# Patient Record
Sex: Female | Born: 1985 | Hispanic: No | Marital: Married | Smoking: Never smoker
Health system: Southern US, Community
[De-identification: ages and names within clinical notes are randomized; demographics above are authoritative.]

## PROBLEM LIST (undated history)

## (undated) DIAGNOSIS — Z789 Other specified health status: Secondary | ICD-10-CM

## (undated) HISTORY — PX: NO PAST SURGERIES: SHX2092

## (undated) HISTORY — DX: Other specified health status: Z78.9

---

## 2010-06-05 NOTE — L&D Delivery Note (Addendum)
Delivery Note At 11:39 PM a viable female was delivered via Vaginal, Spontaneous Delivery (Presentation: Right Occiput Anterior). The head was very slow coming, and a turtle sign was noted.  Dr. Despina Hidden called to come.  The right (posterior) axilla was grasped with my index finger, rotated clockwise into an oblique position, and the right shoulder delivered anteriorly.  THe body delivered without difficulty after that. APGAR: 4, 8; weight 7 lb 12.3 oz (3525 g).   Placenta status: Intact, Spontaneous.  Cord: 3 vessels with the following complications: None. Dr. Armen Pickup performed delivery of fetal head, I took over for the shoulders, and she completed 3rd stage  Anesthesia: Epidural  Episiotomy: None Lacerations: 1st degree;Periurethral Suture Repair: 3-0 Est. Blood Loss (mL): 200  Mom to postpartum.  Baby to nursery-stable.  CRESENZO-DISHMAN,Kanye Depree 04/16/2011, 11:57 PM

## 2010-10-18 ENCOUNTER — Other Ambulatory Visit: Payer: Self-pay

## 2010-10-18 ENCOUNTER — Ambulatory Visit (INDEPENDENT_AMBULATORY_CARE_PROVIDER_SITE_OTHER): Payer: Self-pay | Admitting: Family Medicine

## 2010-10-18 ENCOUNTER — Encounter: Payer: Self-pay | Admitting: Family Medicine

## 2010-10-18 VITALS — BP 117/76 | Temp 97.9°F | Wt 151.8 lb

## 2010-10-18 DIAGNOSIS — Z3400 Encounter for supervision of normal first pregnancy, unspecified trimester: Secondary | ICD-10-CM | POA: Insufficient documentation

## 2010-10-18 DIAGNOSIS — Z331 Pregnant state, incidental: Secondary | ICD-10-CM

## 2010-10-18 NOTE — Progress Notes (Signed)
Prenatal labs done today Krystal Marshall 

## 2010-10-18 NOTE — Patient Instructions (Signed)
Nice to meet you. Come to pregnancy group on Thursday at 2:30 at [facility]. If you are unable to get off work, call for another appointment.  Your baby sounds healthy.  Pregnancy - First Trimester During sexual intercourse, millions of sperm go into the vagina. Only 1 sperm will penetrate and fertilize the female egg while it is in the Fallopian tube. One week later, the fertilized egg implants into the wall of the uterus. An embryo begins to develop into a baby. At 6 to 8 weeks, the eyes and face are formed and the heartbeat can be seen on ultrasound. At the end of 12 weeks (first trimester), all the baby's organs are formed. Now that you are pregnant, you will want to do everything you can to have a healthy baby. Two of the most important things are to get good prenatal care and follow your caregiver's instructions. Prenatal care is all the medical care you receive before the baby's birth. It is given to prevent, find and treat problems during the pregnancy and childbirth. PRENATAL EXAMS:  During prenatal visits, your weight, blood pressure and urine are checked. This is done to make sure you are healthy and progressing normally during the pregnancy.   A pregnant woman should gain 25 to 35 pounds during the pregnancy. However, if you are over weight or underweight, your caregiver will advise you regarding your weight.   Your caregiver will ask and answer questions for you.   Blood work, cervical cultures, other necessary tests and a Pap test are done during your prenatal exams. These tests are done to check on your health and the probable health of your baby. Tests are strongly recommended and done for HIV with your permission. This is the virus that causes AIDS. These tests are done because medications can be given to help prevent your baby from being born with this infection should you have been infected without knowing it. Blood work is also used to find out your blood type, previous  infections and follow your blood levels (hemoglobin).   Low hemoglobin (anemia) is common during pregnancy. Iron and vitamins are given to help prevent this. Later in the pregnancy, blood tests for diabetes will be done along with any other tests if any problems develop. You may need tests to make sure you and the baby are doing well.   You may need other tests to make sure you and the baby are doing well.  CHANGES DURING THE FIRST TRIMESTER (THE FIRST 3 MONTHS OF PREGNANCY) Your body goes through many changes during pregnancy. They vary from person to person. Talk to your caregiver about changes you notice and are concerned about. Changes can include:  Your menstrual period stops.   The egg and sperm carry the genes that determine what you look like. Genes from you and your partner are forming a baby. The female genes determine whether the baby is a boy or a girl.   Your body increases in girth and you may feel bloated.   Feeling sick to your stomach (nauseous) and throwing up (vomiting). If the vomiting is uncontrollable, call your caregiver.   Your breasts will begin to enlarge and become tender.   Your nipples may stick out more and become darker.   The need to urinate more. Painful urination may mean you have a bladder infection.   Tiring easily.   Loss of appetite.   Cravings for certain kinds of food.   At first, you may gain or  lose a couple of pounds.   You may have changes in your emotions from day to day (excited to be pregnant or concerned something may go wrong with the pregnancy and baby).   You may have more vivid and strange dreams.  HOME CARE INSTRUCTIONS  It is very important to avoid all smoking, alcohol and un-prescribed drugs during your pregnancy. These affect the formation and growth of the baby. Avoid chemicals while pregnant to ensure the delivery of a healthy infant.   Start your prenatal visits by the 12th week of pregnancy. They are usually scheduled  monthly at first, then more often in the last 2 months before delivery. Keep your caregiver's appointments. Follow your caregiver's instructions regarding medication use, blood and lab tests, exercise, and diet.   During pregnancy, you are providing food for you and your baby. Eat regular, well-balanced meals. Choose foods such as meat, fish, milk and other low fat dairy products, vegetables, fruits, and whole-grain breads and cereals. Your caregiver will tell you of the ideal weight gain.   You can help morning sickness by keeping soda crackers (saltines) at the bedside. Eat a couple before arising in the morning. You may want to use the crackers without salt on them.   Eating 4 to 5 small meals rather than 3 large meals a day also may help the nausea and vomiting.   Drinking liquids between meals instead of during meals also seems to help nausea and vomiting.   A physical sexual relationship may be continued throughout pregnancy if there are no other problems. Problems may be early (premature) leaking of amniotic fluid from the membranes, vaginal bleeding, or belly (abdominal) pain.   Exercise regularly if there are no restrictions. Check with your caregiver or physical therapist if you are unsure of the safety of some of your exercises. Greater weight gain will occur in the last 2 trimesters of pregnancy. Exercising will help:   Control your weight.   Keep you in shape.   Prepare you for labor and delivery.   Help you lose your pregnancy weight after you deliver your baby.   Wear a good support or jogging bra for breast tenderness during pregnancy. This may help if worn during sleep too.   Ask when prenatal classes are available. Begin classes when they are offered.   Do not use hot tubs, steam rooms or saunas.   Wear your seat belt when driving. This protects you and your baby if you are in an accident.   Avoid raw meat, uncooked cheese, cat litter boxes and soil used by cats  throughout the pregnancy. These carry germs that can cause birth defects in the baby.   The first trimester is a good time to visit your dentist for your dental health. Getting your teeth cleaned is OK. Use a softer toothbrush and brush gently during pregnancy.   Ask for help if you have financial, counseling or nutritional needs during pregnancy. Your caregiver will be able to offer counseling for these needs as well as refer you for other special needs.   Do not take any medications or herbs unless told by your caregiver.   Inform your caregiver if there is any mental or physical domestic violence.   Make a list of emergency phone numbers of family, friends, hospital, police and fire department.   Write down your questions. Take them to your prenatal visit.   Do not douche.   Do not cross your legs.   If you have  to stand for long periods of time, rotate you feet or take small steps in a circle.   You may have more vaginal secretions that may require a sanitary pad. Do not use tampons or scented sanitary pads.  MEDICATIONS AND DRUG USE IN PREGNANCY  Take prenatal vitamins as directed. The vitamin should contain 1 milligram of folic acid. Keep all vitamins out of reach of children. Only a couple vitamins or tablets containing iron may be fatal to a baby or young child when ingested.   Avoid use of all medications, including herbs, over-the-counter medications, not prescribed or suggested by your caregiver. Only take over-the-counter or prescription medicines for pain, discomfort, or fever as directed by your caregiver. Do not use aspirin, ibuprofen (Motrin, Advil, Nuprin) or naproxen (Aleve) unless OK'd by your caregiver.   Let your caregiver also know about herbs you may be using.   Alcohol is related to a number of birth defects. This includes fetal alcohol syndrome. All alcohol, in any form, should be avoided completely. Smoking will cause low birth rate and premature babies.    Street/illegal drugs are very harmful to the baby. They are absolutely forbidden. A baby born to an addicted mother will be addicted at birth. The baby will go through the same withdrawal an adult does.   Let your caregiver know about any medications that you have to take and for what reason you take them.  MISCARRIAGE IS COMMON DURING PREGNANCY A miscarriage does not mean you did something wrong. It is not a reason to worry about getting pregnant again. Your caregiver will help you with questions you may have. If you have a miscarriage, you may need minor surgery (a D & C). SEEK MEDICAL CARE IF:  You have any concerns or worries during your pregnancy. It is better to call with your questions if you feel they cannot wait, rather than worry about them.  SEEK IMMEDIATE MEDICAL CARE IF:  An unexplained oral temperature above 101 develops, or as your caregiver suggests.   You have leaking of fluid from the vagina (birth canal). If leaking membranes are suspected, take your temperature and inform your caregiver of this when you call.   There is vaginal spotting or bleeding. Notify your caregiver of the amount and how many pads are used.   You develop a bad smelling vaginal discharge with a change in the color.   You continue to feel sick to your stomach (nauseated) and have no relief from remedies suggested. You vomit blood or coffee ground like materials.   You lose more than 2 pounds of weight in one week.   You gain more than 2 pounds of weight in a week and you notice swelling of your face, hands, feet or legs.   You gain 5 pounds or more in 1 week (even if you do not have swelling of your hands, face, legs or feet).   You get exposed to [place] measles and have never had them.   You are exposed to fifth disease or chicken pox.   You develop belly (abdominal) pain. Round ligament discomfort is a common non-cancerous (benign) cause of abdominal pain in pregnancy. Your caregiver still  must evaluate this.   You develop headache, fever, diarrhea, pain with urination, or shortness of breath.   You fall, are in a car accident or have any kind of trauma.   There is mental or physical violence in your home.  Document Released: 05/16/2001 Document Re-Released: 11/09/2009 [employer] Patient Information  2011 [place], [place].

## 2010-10-18 NOTE — Progress Notes (Signed)
G1 presents with uncertain LMP. Cultural/language barrier, recently immigrated from [place], Arabic is primary language. Husband interprets. No significant PMH, FH. Plans to join [facility] program for group prenatal care. Only problems are finding stable job and financial hardship. Medicaid is pending currently. No concerns with health and weight is stable.  Physical exam unremarkable, fundus located approximately at umbilicus, nontender.  F/u at group prenatal care then in 4 weeks. Will continue routine care and plan for dating ultrasound/anatomy scan as soon as medicaid allows. Prenatal labs today. Will need pap smear and GC/Chl screen at next visit.

## 2010-10-19 LAB — HIV ANTIBODY (ROUTINE TESTING W REFLEX): HIV: NONREACTIVE

## 2010-10-19 LAB — OBSTETRIC PANEL
Antibody Screen: NEGATIVE
Basophils Absolute: 0 10*3/uL (ref 0.0–0.1)
Basophils Relative: 0 % (ref 0–1)
Eosinophils Absolute: 0.1 10*3/uL (ref 0.0–0.7)
Eosinophils Relative: 1 % (ref 0–5)
HCT: 35 % — ABNORMAL LOW (ref 36.0–46.0)
Hemoglobin: 11.8 g/dL — ABNORMAL LOW (ref 12.0–15.0)
Hepatitis B Surface Ag: NEGATIVE
Lymphocytes Relative: 30 % (ref 12–46)
Lymphs Abs: 3.6 10*3/uL (ref 0.7–4.0)
MCH: 26.9 pg (ref 26.0–34.0)
MCHC: 33.7 g/dL (ref 30.0–36.0)
MCV: 79.7 fL (ref 78.0–100.0)
Monocytes Absolute: 0.6 10*3/uL (ref 0.1–1.0)
Monocytes Relative: 5 % (ref 3–12)
Neutro Abs: 7.6 10*3/uL (ref 1.7–7.7)
Neutrophils Relative %: 64 % (ref 43–77)
Platelets: 326 10*3/uL (ref 150–400)
RBC: 4.39 MIL/uL (ref 3.87–5.11)
RDW: 14.4 % (ref 11.5–15.5)
Rh Type: POSITIVE
Rubella: >500 IU/mL — ABNORMAL HIGH
WBC: 11.9 10*3/uL — ABNORMAL HIGH (ref 4.0–10.5)

## 2010-10-19 LAB — SICKLE CELL SCREEN: Sickle Cell Screen: NEGATIVE

## 2010-10-19 NOTE — Progress Notes (Signed)
Note reviewed. Labs still pending. Given her unsure LMP, will need dating sono asap.  Will discuss at group visit tomorrow.  Will also need to confirm varicella status and do pap/gc/cz. Since pt was referred through [facility], it is unlikely that she will qualify for Medicaid.  Will suggest that she apply for orange card.

## 2010-10-20 ENCOUNTER — Ambulatory Visit: Payer: Self-pay | Admitting: Family Medicine

## 2010-10-20 VITALS — BP 126/78 | Wt 155.0 lb

## 2010-10-20 DIAGNOSIS — Z3400 Encounter for supervision of normal first pregnancy, unspecified trimester: Secondary | ICD-10-CM

## 2010-10-20 LAB — CULTURE, OB URINE
Colony Count: NO GROWTH
Organism ID, Bacteria: NO GROWTH

## 2010-10-20 NOTE — Progress Notes (Signed)
Here for group visit.  S: Feels well today. No symptoms. Is happy and healthy. Very excited about her baby! O: Vs noted. See flowsheet.  A:/P 25 yo G1P0 at 17 weeks and 4 days. Feels well.  1) Pregnancy: Needs [place] scheduled. Also needs internal exam and labs performed today.   Addendum: Pelvic exam:Pt's first pelvic exam  And she is very apprehensive.  Normal external female genitalia.  No external lesions.  Nl. Vaginal mucosa.  No discharge.  Nl cervix without lesions.  Scant cervical discharge.  Gc/cz cx done today.  Pap done.  Bimanual: no adnexal masses or TTP.  Gravid uterus.  Consistent with dates.  I have reviewed Dr. Zollie Pee note and agree with his plan.  Ms. Riches has recently (about 20 days ago) arrived in [place] and feels somewhat isolated.  Her husband works a few shifts at [employer], but finances are difficult.  We discussed the importance of an early ultrasound for dating, but she really cannot afford it right now.  She reports she has applied for Medicaid and hopes it will come through soon.  Information about the orange card given and pt encouraged to make appt to apply.

## 2010-10-21 ENCOUNTER — Telehealth: Payer: Self-pay | Admitting: *Deleted

## 2010-10-21 LAB — GC/CHLAMYDIA PROBE AMP, GENITAL
Chlamydia, DNA Probe: NEGATIVE
GC Probe Amp, Genital: NEGATIVE

## 2010-10-21 LAB — VARICELLA ZOSTER ANTIBODY, IGG: Varicella IgG: 4.34 {ISR} — ABNORMAL HIGH

## 2010-10-21 NOTE — Telephone Encounter (Signed)
Called and left message for patient to return call. She has [place] scheduled with dr Gaynell Face on 10/28/10 at 11:30. Office is located on [address]. If patient cannot keep appointment she needs to call and reschedule at [phone].Krystal Marshall, [name]

## 2010-10-21 NOTE — Progress Notes (Signed)
[place] scheduled with Dr Gaynell Face on 10/28/10 at 11:30.

## 2010-10-28 ENCOUNTER — Telehealth: Payer: Self-pay | Admitting: Family Medicine

## 2010-10-28 NOTE — Telephone Encounter (Signed)
Patient never returned call.Busick, Robert Lee  

## 2010-10-28 NOTE — Telephone Encounter (Signed)
Calling to let us know pt showed up for u/s appt but says no one told her she had to pay $200.

## 2010-11-16 NOTE — Progress Notes (Signed)
Here for group visit. Is accompanied by her husband Timothy Lasso) S: Pt is doing well. She complains of feet and hand falling asleep and pain. Swelling in legs. She is not feeling the baby move yet. She and her husband are working on obtaining the orange card for health insurance and request referral/list of social services to assist with finding employment. Sheee denies vaginal bleeding and discharge. She takes her prenatal vitamin sometimes.  O: VS reviewed. BP normal.  GEN: NAD. Well appearing.  ABD: Gravid, above umbilicus EXT: no edema A/P:  25 yo primigravida at [redacted]w[redacted]d by uncertain LMP.  1. Pregnancy-progressing well. Good interval wt gain. Prenatal labs reviewed. Plan:  Pt will need an individual visit for pelvic exam, GC/Chlam and pap smear. Will call pt and flag administration.  Ask about varicella status. Plan to get pt in with myself or Dr. Fara Boros at earliest available appointment. We discussed physical activity in pregnancy and had an introduction into preterm labor.  Yoga instructor demonstrated some exercises that may improve circulation and soothe muscle so extremity pain is hopefully not as bad. The container focused on carpal tunnel, round ligament pain, headache and breast tenderness. Plan to have pt f/u and next GIFT visit.  2. Swelling- no LE edema today. BP normal. Advised trial of decreased salt intake to determine is too much salt is causing fluid retention. 3. No sensation of fetal movement-Per pt she has never felt the baby move well. Interval wt gain and strong FHT are reassuring. I anticipate Fetal movement to be appreciated within the next few weeks.  4. F/u- at next available appointment (myself or [facility]) for pelvic exam. In 4 weeks for next GIFT.

## 2010-11-17 ENCOUNTER — Encounter: Payer: Self-pay | Admitting: Family Medicine

## 2010-11-17 ENCOUNTER — Ambulatory Visit (INDEPENDENT_AMBULATORY_CARE_PROVIDER_SITE_OTHER): Payer: Medicaid Other | Admitting: Family Medicine

## 2010-11-17 VITALS — BP 115/70 | Wt 158.0 lb

## 2010-11-17 DIAGNOSIS — Z3400 Encounter for supervision of normal first pregnancy, unspecified trimester: Secondary | ICD-10-CM

## 2010-11-17 NOTE — Assessment & Plan Note (Signed)
See progress note.

## 2010-11-21 ENCOUNTER — Telehealth: Payer: Self-pay | Admitting: Family Medicine

## 2010-11-21 DIAGNOSIS — Z3400 Encounter for supervision of normal first pregnancy, unspecified trimester: Secondary | ICD-10-CM

## 2010-11-21 NOTE — Telephone Encounter (Signed)
Made appt for [place] June 22,2012 @ 845 am at [facility] radiology. Pt notified and agreed.Laureen Ochs, Viann Shove

## 2010-11-21 NOTE — Telephone Encounter (Signed)
Patient stopped by and now has pregnancy medicaid.  She would like to be set up with an ultrasound now.  Please call her concerning this.  Thanks!

## 2010-11-21 NOTE — Progress Notes (Signed)
Reviewed patients chart and discussed with Dr. [place]. The pt did have a pelvic exam with CG/Chlam and Pap done. All normal.

## 2010-11-25 ENCOUNTER — Ambulatory Visit (HOSPITAL_COMMUNITY)
Admission: RE | Admit: 2010-11-25 | Discharge: 2010-11-25 | Disposition: A | Discharge status: Home / Self Care | Payer: Medicaid Other | Source: Ambulatory Visit | Attending: Family Medicine | Admitting: Family Medicine

## 2010-11-25 DIAGNOSIS — O358XX0 Maternal care for other (suspected) fetal abnormality and damage, not applicable or unspecified: Secondary | ICD-10-CM | POA: Insufficient documentation

## 2010-11-25 DIAGNOSIS — Z3400 Encounter for supervision of normal first pregnancy, unspecified trimester: Secondary | ICD-10-CM

## 2010-11-25 DIAGNOSIS — Z1389 Encounter for screening for other disorder: Secondary | ICD-10-CM | POA: Insufficient documentation

## 2010-11-25 DIAGNOSIS — Z363 Encounter for antenatal screening for malformations: Secondary | ICD-10-CM | POA: Insufficient documentation

## 2010-12-05 ENCOUNTER — Encounter: Payer: Self-pay | Admitting: Family Medicine

## 2010-12-05 NOTE — Progress Notes (Signed)
Updated EDC based on recent [redacted]w[redacted]d U/S. Will use U/S for due date since pt was unsure of LMP and the dates are 4+ weeks off.

## 2010-12-15 ENCOUNTER — Ambulatory Visit (INDEPENDENT_AMBULATORY_CARE_PROVIDER_SITE_OTHER): Payer: Medicaid Other | Admitting: Family Medicine

## 2010-12-15 DIAGNOSIS — Z3480 Encounter for supervision of other normal pregnancy, unspecified trimester: Secondary | ICD-10-CM

## 2010-12-15 NOTE — Patient Instructions (Signed)
I am glad to see you today - congratulations on the job! We will plan to see you at [facility] for out next appointment - July 26 at 2:30. If you have bleeding, frequent contractions, or leaking of liquid from your vagina, please go to [facility] hospital.

## 2010-12-15 NOTE — Progress Notes (Signed)
25 yo here presents for group after group ended - husband now working third shift, so hard to get to appts.  Does have some pain in legs at the end of the day.  No vaginal discharge

## 2010-12-27 ENCOUNTER — Encounter: Payer: Self-pay | Admitting: Family Medicine

## 2010-12-29 ENCOUNTER — Ambulatory Visit (INDEPENDENT_AMBULATORY_CARE_PROVIDER_SITE_OTHER): Payer: Medicaid Other | Admitting: Family Medicine

## 2010-12-29 VITALS — BP 119/60 | Wt 164.0 lb

## 2010-12-29 DIAGNOSIS — Z3400 Encounter for supervision of normal first pregnancy, unspecified trimester: Secondary | ICD-10-CM

## 2010-12-29 NOTE — Patient Instructions (Signed)
Our next group will meet as scheduled on Thursday, August 9th at 2:30. We will meet in classroom 4 at [facility].  This is by the clinics and the education entrance.    If you have bleeding, if your water breaks, if you have regular contractions that last for 2 hours, or if the baby is not moving well, please go to [facility] to be evaluated.

## 2010-12-29 NOTE — Progress Notes (Signed)
S: Here today for group prenatal visit.  No specific concerns.  Is having some swelling in feet at night time, but otherwise doing well.  O: See flow sheet  A/P: G1P0 @ 23.2 by 18 wk [place] 1) Pregnancy: Progressing well; will need 1hr GTT and 28 week labs in 4 weeks at next appt at [facility] 2) Social: Required [place] to find transportation for her to get home today; will need to d/w pt that she should be making these arrangements  3) F/u in 2 weeks at [facility] for next group visit.

## 2010-12-30 ENCOUNTER — Ambulatory Visit: Payer: Medicaid Other

## 2011-01-02 NOTE — Progress Notes (Signed)
Pt seen in GIFT group prenatal visit with Dr. Fara Boros Reviewed baby care, preterm labor and kick counts.

## 2011-01-11 ENCOUNTER — Telehealth: Payer: Self-pay | Admitting: Family Medicine

## 2011-01-11 NOTE — Telephone Encounter (Signed)
Left VM for pt and husband reminding them that we meet for GIFT tomorrow at  [facility] in Classroom 4 at 2:30.

## 2011-01-13 ENCOUNTER — Ambulatory Visit (INDEPENDENT_AMBULATORY_CARE_PROVIDER_SITE_OTHER): Payer: Medicaid Other | Admitting: Family Medicine

## 2011-01-13 ENCOUNTER — Telehealth: Payer: Self-pay | Admitting: *Deleted

## 2011-01-13 ENCOUNTER — Ambulatory Visit: Payer: Medicaid Other | Admitting: Family Medicine

## 2011-01-13 VITALS — BP 120/76 | HR 80 | Temp 98.1°F | Ht 65.0 in | Wt 164.0 lb

## 2011-01-13 DIAGNOSIS — J029 Acute pharyngitis, unspecified: Secondary | ICD-10-CM | POA: Insufficient documentation

## 2011-01-13 DIAGNOSIS — Z3400 Encounter for supervision of normal first pregnancy, unspecified trimester: Secondary | ICD-10-CM

## 2011-01-13 LAB — POCT RAPID STREP A (OFFICE): Rapid Strep A Screen: NEGATIVE

## 2011-01-13 NOTE — Telephone Encounter (Signed)
Patient reports fever started yesterday. She has not actually checked temp but feels feverish. Has cold symptoms, congestion in nose and cough, appointment scheduled today at 1:30 PM

## 2011-01-13 NOTE — Patient Instructions (Addendum)
Viral Pharyngitis Viral pharyngitis is a sore throat caused by a cold virus. This is not a strep throat. This does not require treatment by an antibiotic (medications that kill germs). It will get better on its own. Antibiotics generally will not help unless this condition is followed by a bacterial (germ) infection. HOME CARE INSTRUCTIONS  Drink or give your child plenty of fluids. Soft, cold foods such as ice cream, popsicles, or jello may be used.   Gargle with warm salt water (one teaspoon salt per quart of water).   If over age 86, throat lozenges may be used safely.   Only take over-the-counter or prescription medicines for pain, discomfort, or fever as directed by your caregiver. DO NOT USE ASPIRIN.  SEEK MEDICAL CARE IF:  You are better in a few days, then become worse.   You have a fever or pain unresponsive to pain medicines.   There are any other changes that concern you.  Diagnostic tests may have been performed. If you have not received results at time of discharge, receive instructions as how to obtain these results.  Document Released: 03/01/2005 Document Re-Released: 11/08/2007 [facility] Patient Information 2011 [place], [place].   Thank you all for coming in today. You appear to have a viral infection. Please see the following care instructions. Please take Tylenol for pain/aches.   Since you are measuring greater than your dates. I have set up an U/S. Also please come back to the clinic next week Friday for a 1 hr glucola.   -Dr. Armen Pickup

## 2011-01-13 NOTE — Progress Notes (Signed)
  Subjective:    Patient ID: Krystal Marshall, female    DOB: November 26, 1985, 25 y.o.   MRN: [id number]  HPI Pt here accompanied by husband. She missed the GIFT group yesterday because she was feeling ill. She reports one day history of sore throat, hot and cold flashes, body aches, nausea and 2 episodes of vomiting. She describes NB/NB emesis yesterday AM and this AM. She denies sick contacts. 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 She has not taking anything for her symptoms.   She reports fetal movement. She denies vaginal bleeding, LOF, contractions.   Review of Systems As per HPI     Objective:   Physical Exam  Constitutional: She appears well-developed and well-nourished. No distress.  HENT:  Head: Normocephalic and atraumatic.  Right Ear: Tympanic membrane, external ear and ear canal normal.  Left Ear: Tympanic membrane, external ear and ear canal normal.  Nose: Nose normal.  Mouth/Throat: Uvula is midline and mucous membranes are normal. Posterior oropharyngeal erythema present. No oropharyngeal exudate or tonsillar abscesses.       Shotty anterior cervical lymphadenopathy.   Abdominal:       Gravid, FH 28 cm, FHT 164, FM appreciated.           Assessment & Plan:

## 2011-01-16 NOTE — Assessment & Plan Note (Addendum)
Reassuring maternal fetal status.   Reviewed prenatal labs. Size > dates: f/u ultrasound and 1 hr glucola (1 week).  F/u in 2 weeks for next GIFT.

## 2011-01-16 NOTE — Assessment & Plan Note (Signed)
History and physical exam findings consistent with viral pharyngitis. Pt does not meet criteria for strep. No strep on rapid swab. Plan: conservative treatment with salt water gargles, popsicles, tylenol, rest.  Red flags to prompt return to care.

## 2011-01-20 ENCOUNTER — Ambulatory Visit (HOSPITAL_COMMUNITY): Admission: RE | Admit: 2011-01-20 | Payer: Medicaid Other | Source: Ambulatory Visit

## 2011-01-25 ENCOUNTER — Ambulatory Visit (HOSPITAL_COMMUNITY)
Admission: RE | Admit: 2011-01-25 | Discharge: 2011-01-25 | Disposition: A | Discharge status: Home / Self Care | Payer: Medicaid Other | Source: Ambulatory Visit | Attending: Family Medicine | Admitting: Family Medicine

## 2011-01-25 DIAGNOSIS — Z3400 Encounter for supervision of normal first pregnancy, unspecified trimester: Secondary | ICD-10-CM

## 2011-01-25 DIAGNOSIS — Z3689 Encounter for other specified antenatal screening: Secondary | ICD-10-CM | POA: Insufficient documentation

## 2011-01-25 DIAGNOSIS — O3660X0 Maternal care for excessive fetal growth, unspecified trimester, not applicable or unspecified: Secondary | ICD-10-CM | POA: Insufficient documentation

## 2011-01-26 ENCOUNTER — Ambulatory Visit: Payer: Medicaid Other | Admitting: Family Medicine

## 2011-02-08 ENCOUNTER — Telehealth: Payer: Self-pay | Admitting: Family Medicine

## 2011-02-08 NOTE — Telephone Encounter (Signed)
Lft message for pt on husband's cell reminding her of appt tomorrow.  Also emphasized importance of keeping appt and need for lab tests.

## 2011-02-09 ENCOUNTER — Encounter: Payer: Self-pay | Admitting: Family Medicine

## 2011-02-22 ENCOUNTER — Encounter (HOSPITAL_COMMUNITY): Payer: Self-pay | Admitting: *Deleted

## 2011-02-22 ENCOUNTER — Inpatient Hospital Stay (HOSPITAL_COMMUNITY)
Admission: AD | Admit: 2011-02-22 | Discharge: 2011-02-22 | Disposition: A | Discharge status: Home / Self Care | Payer: Medicaid Other | Source: Ambulatory Visit | Attending: Obstetrics & Gynecology | Admitting: Obstetrics & Gynecology

## 2011-02-22 ENCOUNTER — Telehealth: Payer: Self-pay | Admitting: Family Medicine

## 2011-02-22 DIAGNOSIS — O99891 Other specified diseases and conditions complicating pregnancy: Secondary | ICD-10-CM | POA: Insufficient documentation

## 2011-02-22 DIAGNOSIS — Z711 Person with feared health complaint in whom no diagnosis is made: Secondary | ICD-10-CM

## 2011-02-22 LAB — URINE MICROSCOPIC-ADD ON

## 2011-02-22 LAB — URINALYSIS, ROUTINE W REFLEX MICROSCOPIC
Bilirubin Urine: NEGATIVE
Glucose, UA: NEGATIVE mg/dL
Ketones, ur: NEGATIVE mg/dL
Leukocytes, UA: NEGATIVE
Nitrite: NEGATIVE
Protein, ur: NEGATIVE mg/dL
Specific Gravity, Urine: <1.005 — ABNORMAL LOW (ref 1.005–1.030)
Urobilinogen, UA: 0.2 mg/dL (ref 0.0–1.0)
pH: 6 (ref 5.0–8.0)

## 2011-02-22 LAB — FETAL FIBRONECTIN: Fetal Fibronectin: NEGATIVE

## 2011-02-22 MED ORDER — HYDROXYZINE HCL 50 MG/ML IM SOLN
50.0000 mg | Freq: Once | INTRAMUSCULAR | Status: AC
  Administered 2011-02-22: 50 mg via INTRAMUSCULAR
  Filled 2011-02-22: qty 1

## 2011-02-22 NOTE — Progress Notes (Signed)
FFN collected.

## 2011-02-22 NOTE — Progress Notes (Signed)
Pt moving her abdomen around in between contractions.  Appears like flexing abdominal muscles. This is tracing on tocometer.

## 2011-02-22 NOTE — Progress Notes (Signed)
Krystal Marshall, CNM at bedside.  poc discussed with pt.

## 2011-02-22 NOTE — Progress Notes (Signed)
Pt presents for contractions and pressure via ems.  Feels lower abdominal pressure.

## 2011-02-22 NOTE — Progress Notes (Signed)
E. Rice at bedside.  Assessment done and poc discussed with pt.

## 2011-02-22 NOTE — ED Provider Notes (Signed)
History   Pt presents today via EMS c/o "the urge to push." She is currently 31.1wks. She states the pain began around 2am. She denies recent intercourse. She reports GFM and denies vag dc, bleeding, fever, or any other sx at this time.  No chief complaint on file.  HPI  OB History    Grav Para Term Preterm Abortions TAB SAB Ect Mult Living   1               No past medical history on file.  No past surgical history on file.  No family history on file.  History  Substance Use Topics  . Smoking status: Never Smoker   . Smokeless tobacco: Not on file  . Alcohol Use: No    Allergies: No Known Allergies  Prescriptions prior to admission  Medication Sig Dispense Refill  . Prenat w/o A-FE-DSS-Methfol-FA (PRENATAL MULTIVITAMIN) 90-600-400 MG-MCG-MCG tablet Take 1 tablet by mouth daily.          Review of Systems  Constitutional: Negative for fever.  Cardiovascular: Negative for chest pain.  Gastrointestinal: Positive for abdominal pain. Negative for nausea, vomiting, diarrhea and constipation.  Genitourinary: Negative for dysuria, urgency, frequency and hematuria.  Neurological: Negative for dizziness and headaches.  Psychiatric/Behavioral: Negative for depression and suicidal ideas.   Physical Exam   Last menstrual period 06/19/2010.  Physical Exam  Constitutional: She is oriented to person, place, and time. She appears well-developed and well-nourished. No distress.  HENT:  Head: Normocephalic and atraumatic.  Eyes: EOM are normal. Pupils are equal, round, and reactive to light.  GI: Soft. She exhibits no distension. There is no tenderness. There is no rebound and no guarding.       Pt appears to be moving her abd up and down like someone who is getting ready to vomit. Pt denies nausea.  Genitourinary: No bleeding around the vagina. No vaginal discharge found.       Ffn collected prior to digital exam. Cervix Lg/closed. No presenting part in the pelvis.  Neurological:  She is alert and oriented to person, place, and time.  Skin: Skin is warm and dry. She is not diaphoretic.  Psychiatric: Her speech is normal. Judgment and thought content normal. Her mood appears anxious. Cognition and memory are normal.    MAU Course  Procedures  Ffn collected.  Philipp Deputy, CNM notified of pt and is on her way to assume care of pt.  Assessment and Plan    Clinton Gallant. Rice III, DrHSc, MPAS, PA-C  02/22/2011, 4:36 AM   Henrietta Hoover, PA 02/22/11 0513  Pt feeling better at this time. Was concerned re the movement of the baby causing downward pressure. FHR 135 reassuring + accels No appreciable ctx per toco (pt movement initially mimicks ctx) Pelvic deferred FFN neg UA sm hgb, otherwise neg  IUP at 31.1 wks Physically well  D/C home with preterm labor precautions. F/U with Dr Sarah [place] as scheduled or sooner prn.

## 2011-02-22 NOTE — Telephone Encounter (Signed)
Called at home/cell. Left voicemail reminding patient of GIFT meeting and topics (breastfeeding and baby checklist).  

## 2011-02-22 NOTE — Progress Notes (Signed)
Pt continues to tighten abdomen but less regularly after vistaril.

## 2011-02-22 NOTE — Progress Notes (Signed)
Pt states she feels a little more relaxed.  

## 2011-02-23 ENCOUNTER — Ambulatory Visit (INDEPENDENT_AMBULATORY_CARE_PROVIDER_SITE_OTHER): Payer: Medicaid Other | Admitting: Family Medicine

## 2011-02-23 VITALS — BP 108/66 | Temp 98.6°F | Wt 179.0 lb

## 2011-02-23 DIAGNOSIS — Z3400 Encounter for supervision of normal first pregnancy, unspecified trimester: Secondary | ICD-10-CM

## 2011-02-23 DIAGNOSIS — Z23 Encounter for immunization: Secondary | ICD-10-CM

## 2011-02-23 LAB — GLUCOSE, CAPILLARY: Glucose-Capillary: 148 mg/dL — ABNORMAL HIGH (ref 70–99)

## 2011-02-24 LAB — CBC
HCT: 29.5 % — ABNORMAL LOW (ref 36.0–46.0)
Hemoglobin: 9.4 g/dL — ABNORMAL LOW (ref 12.0–15.0)
MCH: 24.7 pg — ABNORMAL LOW (ref 26.0–34.0)
MCHC: 31.9 g/dL (ref 30.0–36.0)
MCV: 77.6 fL — ABNORMAL LOW (ref 78.0–100.0)
Platelets: 347 10*3/uL (ref 150–400)
RBC: 3.8 MIL/uL — ABNORMAL LOW (ref 3.87–5.11)
RDW: 14.4 % (ref 11.5–15.5)
WBC: 10.3 10*3/uL (ref 4.0–10.5)

## 2011-02-24 LAB — HIV ANTIBODY (ROUTINE TESTING W REFLEX): HIV: NONREACTIVE

## 2011-02-24 LAB — RPR

## 2011-02-24 NOTE — Progress Notes (Signed)
S: Pt here for GIFT group prenatal visit. Reports going to MAU on 9/19 for a labor check. She was not having ctx, cervix long and closed and fetal fibronectin was negative. She was concerned that downward pressure was a sign of labor. She is accompanied by her husband who has chosen to wait in the waiting room during the session because he is tired from working third shift. Having a baby boy Kaleen Mask).  O: see flowsheet. A/P: Pregnancy progressing well. DNKA multiple GIFTs. Obtained 1 hr glucola, HIV, RPR, CBC today. Administered flu vaccine and Tdap. Discussed breastfeeding in GIFT. Vertex by bedside ultrasound.  1.) Elevated: 1 hr glucola. Will obtain 3 hr glucola on Monday. If this is elevated she will need referral to [facility] for evaluation.  2.) Anemia: Patient to continue taking prenatal vitamin with iron.  3.) Feeding: breast. 4.) Circ: yes 5.) Birth control: unsure  6.) F/u: Pt will benefit from one on one visits after last GIFT in 2 weeks. Need pelvic exam (GBS. GC/Chlam) in 4-5 weeks.

## 2011-02-26 ENCOUNTER — Encounter: Payer: Self-pay | Admitting: Family Medicine

## 2011-02-26 MED ORDER — FERROUS SULFATE 325 (65 FE) MG PO TBEC: 325.0000 mg | DELAYED_RELEASE_TABLET | Freq: Two times a day (BID) | ORAL | Status: DC

## 2011-02-26 NOTE — Progress Notes (Signed)
Addended by: [place], Takerra Lupinacci T on: 02/26/2011 10:59 AM   Modules accepted: Orders

## 2011-02-27 ENCOUNTER — Other Ambulatory Visit: Payer: Medicaid Other

## 2011-02-27 DIAGNOSIS — Z3400 Encounter for supervision of normal first pregnancy, unspecified trimester: Secondary | ICD-10-CM

## 2011-02-27 LAB — GLUCOSE, CAPILLARY
Glucose-Capillary: 98 mg/dL (ref 70–99)
Glucose-Capillary: 140 mg/dL — ABNORMAL HIGH (ref 70–99)

## 2011-02-27 NOTE — Progress Notes (Signed)
3 HR GTT DONE TODAY Jayme Mednick 

## 2011-02-28 LAB — GLUCOSE TOLERANCE, 3 HOURS
Glucose Tolerance, 1 hour: 134 mg/dL (ref 70–189)
Glucose Tolerance, 2 hour: 180 mg/dL — ABNORMAL HIGH (ref 70–164)
Glucose Tolerance, Fasting: 87 mg/dL (ref 70–104)
Glucose, GTT - 3 Hour: 128 mg/dL (ref 70–144)

## 2011-03-09 ENCOUNTER — Ambulatory Visit (INDEPENDENT_AMBULATORY_CARE_PROVIDER_SITE_OTHER): Payer: Medicaid Other | Admitting: Family Medicine

## 2011-03-09 NOTE — Patient Instructions (Signed)
Thank you for coming in today. Please make an appointment with Dr. Fara Boros, Armen Pickup, [place] or [place] in 2 weeks. Preterm Labor, Home Care  Preterm labor is defined as having uterine contractions that cause the cervix to open (dilate), shorten and thin (effacement) before completing 37 weeks of pregnancy. Preterm labor accounts for most hospital admissions in pregnant women.   CAUSES  Most cases of preterm labor are unknown.   Small areas of separation of the placenta (abruption).   Excess fluid in the amniotic sac (poly hydramnios).   Twins or more.   The cervix cannot hold the baby because the tissue in the cervix is too weak (incompetent cervix).   Hormone changes.   Vaginal bleeding in more than one of the trimesters.   Infection of the cervix, vagina or bladder.   Smoking.   Antiphosolipid Syndrome. This happens when antibodies affect the protein in the body.  DIAGNOSIS Factors that help predict preterm labor:  History of preterm labor with a past pregnancy.   Bacterial vaginosis in women who previously had preterm labor.   Home uterine activity monitoring that show uterine contractions.   Fetal fibronectin protein that is elevated in women with previous history of preterm labor.   Ultrasound to measure the length of the cervix, if it shows signs of shortening before the due date, it may be a sign of preterm labor.   Using the fibronectin and cervical ultrasound evaluation together is more predictive of impending preterm labor.   Other risk factors include:   Nonwhite race.   Pregnancy in a 89 year old or younger.   Pregnancy in a 50 year old or older.   Low socioeconomic factors.   Low weight gain during the pregnancy.  PREVENTION Not all preterm labor can be prevented. Some early contractions can be prevented with simple measures.  Drink fluids. Drink eight, 8 ounce glasses of fluids per day. Preterm labor rates go up in the summer months. Dehydration makes  the blood volume decrease. This increases the concentration of oxytocin (hormone that causes uterine contractions) in the blood. Hydrating yourself helps prevent this build up.   Watch for signs of infection. Signs include burning during urination, increased need to urinate, abnormal vaginal discharge or unexplained fevers.   Keep your appointments with your caregiver. Call your caregiver right away if you think you are having uterine contractions.   Seek medical advice with questions or problems. It is much better to ask questions of your caregiver than to be in untreated preterm labor unknowingly.  MANAGEMENT OF PRETERM LABOR, IN & OUT OF THE HOSPITAL There are a lot of things to manage in preterm labor. These things include both medical measures and personal care measures for you and/or your baby. Most preterm labor will be handled in the hospital. Things that may be helpful in preterm labor include:  Hydration (oral or IV). Take in eight, 8 ounce glasses of water per day.   Bed rest (home or hospital). Lying on your left side may help.   Avoid intercourse and orgasms.   Medication (antibiotics) to help prevent infection. This is more likely if your membranes have ruptured or if the contractions are caused by infection. Take medications as directed.   Evaluation of your baby. These tests or procedures help the caregiver know how the baby is doing and may do in the case of an early birth. Including:   Biophysical profile.   Non-stress or stress tests.   Amniocentesis to evaluate the  baby for fetal lung maturity.   Amniotic fluid volume index (AFI).   An ultrasound.   Medications (steroids) to help your baby's lungs mature more quickly may be used. This may happen if preterm birth cannot be stopped.   Tocolytic medications (medications that help stop uterine contractions) may help prolong the pregnancy up to 7 days. This is helpful if steroids medication is needed to help the baby's  lungs mature.   Your caregiver may give other advice on preparation for preterm birth.   Progesterone may be beneficial in some cases of preterm labor.  TREATMENT The best treatment is prevention, being aware of risk factors and early detection. Make sure to ask your caregiver to discuss with you the signs and symptoms of preterm labor, especially if you had preterm labor with a previous pregnancy. HOME CARE INSTRUCTIONS  Eat a balanced and nourished diet.   Take your vitamin supplements as directed.   Drink 6 to 8 glasses of liquids a day.   Get plenty of rest and sleep.   Do not have sexual relations if you have preterm labor or are at high risk of having preterm labor.   Follow your care giver's recommendation regarding activities, medications, blood and other tests (ultrasound, amniocentesis, etc.).   Avoid stress.   Avoid hard labor or prolonged exercise if you are at high risk for preterm labor.   Do not smoke.  SEEK IMMEDIATE MEDICAL CARE IF:  You are having contractions.   You have abdominal pain.   You have vaginal bleeding.   You have painful urination.   You have abnormal discharge.   You develop a temperature 102 F (38.9 C) or higher.  Document Released: 05/22/2005 Document Re-Released: 08/16/2009 [facility] Patient Information 2011 [place], [place].

## 2011-03-10 NOTE — Progress Notes (Signed)
Krystal Marshall did not show up for today's visit.  Plan to call and schedule an individual OB appointment.

## 2011-03-15 ENCOUNTER — Telehealth: Payer: Self-pay | Admitting: Family Medicine

## 2011-03-15 NOTE — Telephone Encounter (Signed)
Called pt.  Left VM on cell phone asking pt to call clinic to schedule f/u appt.

## 2011-03-22 ENCOUNTER — Encounter: Payer: Medicaid Other | Admitting: Family Medicine

## 2011-03-30 ENCOUNTER — Ambulatory Visit (INDEPENDENT_AMBULATORY_CARE_PROVIDER_SITE_OTHER): Payer: Medicaid Other | Admitting: Family Medicine

## 2011-03-30 VITALS — BP 125/80 | Wt 188.0 lb

## 2011-03-30 DIAGNOSIS — Z3400 Encounter for supervision of normal first pregnancy, unspecified trimester: Secondary | ICD-10-CM

## 2011-03-30 NOTE — Patient Instructions (Signed)
It was great to see you today. Please go to [facility] if you have any bleeding, if your water breaks (a big gush of water, or frequent dripping of water from your vagina), if the baby is not moving well, or if you have contractions (more than 4 in an hour) Please come back and see [place] in 1 week, and call if you have any questions or concerns.

## 2011-03-30 NOTE — Progress Notes (Signed)
Pt seen with Dr. Mikel Cella.  Agree with her plan and note. Size greater than dates - will check ultrasound. GBS/gc/cz done today. Failed glucola but passed 3 hour.

## 2011-03-30 NOTE — Progress Notes (Signed)
S: Pt doing well today, no specific concerns. No pain/ctx, no bleeding or discharge, baby is moving. Pt does not have a car seat for baby yet. She is also planning on having the baby sleep with her since she does not have a baby bed.  O: See flowsheet. Weight, BP reviewed. PE: NAD. Heart RRR. Lungs CTAB. Abdomen gravid. Pelvic exam revealed closed cervix with minimal discharge.   A/P: Pregnancy progressing well. Pt is at [redacted]w[redacted]d today. UTD on routine prenatal care including flu and TDap. Obtained GC/Chlamydia as well as GBS today. 1) Size greater than dates: Pt measured 39cm today. At 31 weeks, her measurements were consistent with dating. Pt failed one hour glucola earlier in pregnancy. Will refer to [facility] for ultrasound to evaluate size of baby as well as for polyhydramnios. Pt agrees with plan. 2.) Social: Pt has financial difficulty and is unable to purchase car seat or crib. Will speak with Pregnancy Coordinator about resources for this. 3.) Birth Control: Pt and husband plan on using "natural contraception" 4.) Feeding: Breast   5.) Circ: Yes  6.) F/u: Pt will go to [facility] for ultrasound. Return to clinic in one week for 37 week OB visit. Discussed signs of labor.

## 2011-03-31 LAB — GC/CHLAMYDIA PROBE AMP, GENITAL
Chlamydia, DNA Probe: NEGATIVE
GC Probe Amp, Genital: NEGATIVE

## 2011-04-01 LAB — STREP B DNA PROBE: GBSP: NEGATIVE

## 2011-04-04 ENCOUNTER — Ambulatory Visit (HOSPITAL_COMMUNITY)
Admission: RE | Admit: 2011-04-04 | Discharge: 2011-04-04 | Disposition: A | Discharge status: Home / Self Care | Payer: Medicaid Other | Source: Ambulatory Visit | Attending: Family Medicine | Admitting: Family Medicine

## 2011-04-04 DIAGNOSIS — Z3400 Encounter for supervision of normal first pregnancy, unspecified trimester: Secondary | ICD-10-CM

## 2011-04-04 DIAGNOSIS — Z3689 Encounter for other specified antenatal screening: Secondary | ICD-10-CM | POA: Insufficient documentation

## 2011-04-04 DIAGNOSIS — O3660X0 Maternal care for excessive fetal growth, unspecified trimester, not applicable or unspecified: Secondary | ICD-10-CM | POA: Insufficient documentation

## 2011-04-11 ENCOUNTER — Encounter: Payer: Medicaid Other | Admitting: Family Medicine

## 2011-04-16 ENCOUNTER — Encounter (HOSPITAL_COMMUNITY): Payer: Self-pay | Admitting: Anesthesiology

## 2011-04-16 ENCOUNTER — Encounter (HOSPITAL_COMMUNITY): Payer: Self-pay

## 2011-04-16 ENCOUNTER — Inpatient Hospital Stay (HOSPITAL_COMMUNITY)
Admission: AD | Admit: 2011-04-16 | Discharge: 2011-04-18 | DRG: 775 | Disposition: A | Discharge status: Home / Self Care | Payer: Medicaid Other | Source: Ambulatory Visit | Attending: Obstetrics & Gynecology | Admitting: Obstetrics & Gynecology

## 2011-04-16 ENCOUNTER — Inpatient Hospital Stay (HOSPITAL_COMMUNITY): Payer: Medicaid Other | Admitting: Anesthesiology

## 2011-04-16 LAB — CBC
HCT: 30.2 % — ABNORMAL LOW (ref 36.0–46.0)
Hemoglobin: 9.6 g/dL — ABNORMAL LOW (ref 12.0–15.0)
MCH: 23 pg — ABNORMAL LOW (ref 26.0–34.0)
MCHC: 31.8 g/dL (ref 30.0–36.0)
MCV: 72.2 fL — ABNORMAL LOW (ref 78.0–100.0)
Platelets: 309 10*3/uL (ref 150–400)
RBC: 4.18 MIL/uL (ref 3.87–5.11)
RDW: 16.7 % — ABNORMAL HIGH (ref 11.5–15.5)
WBC: 11.9 10*3/uL — ABNORMAL HIGH (ref 4.0–10.5)

## 2011-04-16 LAB — RPR: RPR Ser Ql: NONREACTIVE

## 2011-04-16 MED ORDER — LIDOCAINE HCL 1.5 % IJ SOLN
INTRAMUSCULAR | Status: DC | PRN
  Administered 2011-04-16 (×2): 5 mL via EPIDURAL

## 2011-04-16 MED ORDER — IBUPROFEN 600 MG PO TABS: 600.0000 mg | ORAL_TABLET | Freq: Four times a day (QID) | ORAL | Status: DC | PRN

## 2011-04-16 MED ORDER — EPHEDRINE 5 MG/ML INJ
10.0000 mg | INTRAVENOUS | Status: DC | PRN
  Filled 2011-04-16: qty 4

## 2011-04-16 MED ORDER — ONDANSETRON HCL 4 MG/2ML IJ SOLN: 4.0000 mg | Freq: Four times a day (QID) | INTRAMUSCULAR | Status: DC | PRN

## 2011-04-16 MED ORDER — NALBUPHINE SYRINGE 5 MG/0.5 ML
INJECTION | INTRAMUSCULAR | Status: AC
  Filled 2011-04-16: qty 0.5

## 2011-04-16 MED ORDER — OXYTOCIN 20 UNITS IN LACTATED RINGERS INFUSION - SIMPLE
1.0000 m[IU]/min | INTRAVENOUS | Status: DC
  Administered 2011-04-16: 2 m[IU]/min via INTRAVENOUS

## 2011-04-16 MED ORDER — LACTATED RINGERS IV SOLN: 500.0000 mL | Freq: Once | INTRAVENOUS | Status: DC

## 2011-04-16 MED ORDER — LIDOCAINE HCL (PF) 1 % IJ SOLN
30.0000 mL | INTRAMUSCULAR | Status: DC | PRN
  Administered 2011-04-17: 30 mL via SUBCUTANEOUS
  Filled 2011-04-16: qty 30

## 2011-04-16 MED ORDER — PHENYLEPHRINE 40 MCG/ML (10ML) SYRINGE FOR IV PUSH (FOR BLOOD PRESSURE SUPPORT)
80.0000 ug | PREFILLED_SYRINGE | INTRAVENOUS | Status: DC | PRN
  Filled 2011-04-16: qty 5

## 2011-04-16 MED ORDER — OXYTOCIN BOLUS FROM INFUSION
500.0000 mL | Freq: Once | INTRAVENOUS | Status: DC
  Filled 2011-04-16: qty 500
  Filled 2011-04-16: qty 1000

## 2011-04-16 MED ORDER — NALBUPHINE SYRINGE 5 MG/0.5 ML
5.0000 mg | INJECTION | INTRAMUSCULAR | Status: DC | PRN
  Administered 2011-04-16: 5 mg via INTRAVENOUS
  Filled 2011-04-16: qty 0.5

## 2011-04-16 MED ORDER — TERBUTALINE SULFATE 1 MG/ML IJ SOLN: 0.2500 mg | Freq: Once | INTRAMUSCULAR | Status: AC | PRN

## 2011-04-16 MED ORDER — OXYTOCIN 20 UNITS IN LACTATED RINGERS INFUSION - SIMPLE
125.0000 mL/h | Freq: Once | INTRAVENOUS | Status: AC
  Administered 2011-04-16: 999 mL/h via INTRAVENOUS

## 2011-04-16 MED ORDER — LACTATED RINGERS IV SOLN
INTRAVENOUS | Status: DC
  Administered 2011-04-16: 10:00:00 via INTRAVENOUS
  Administered 2011-04-16: 125 mL/h via INTRAVENOUS
  Administered 2011-04-16: 21:00:00 via INTRAVENOUS
  Administered 2011-04-16: 999 mL/h via INTRAVENOUS

## 2011-04-16 MED ORDER — CITRIC ACID-SODIUM CITRATE 334-500 MG/5ML PO SOLN: 30.0000 mL | ORAL | Status: DC | PRN

## 2011-04-16 MED ORDER — OXYCODONE-ACETAMINOPHEN 5-325 MG PO TABS: 2.0000 | ORAL_TABLET | ORAL | Status: DC | PRN

## 2011-04-16 MED ORDER — EPHEDRINE 5 MG/ML INJ: 10.0000 mg | INTRAVENOUS | Status: DC | PRN

## 2011-04-16 MED ORDER — INFLUENZA VIRUS VACC SPLIT PF IM SUSP: 0.2500 mL | INTRAMUSCULAR | Status: DC | PRN

## 2011-04-16 MED ORDER — LACTATED RINGERS IV SOLN: 500.0000 mL | INTRAVENOUS | Status: DC | PRN

## 2011-04-16 MED ORDER — PHENYLEPHRINE 40 MCG/ML (10ML) SYRINGE FOR IV PUSH (FOR BLOOD PRESSURE SUPPORT): 80.0000 ug | PREFILLED_SYRINGE | INTRAVENOUS | Status: DC | PRN

## 2011-04-16 MED ORDER — DIPHENHYDRAMINE HCL 50 MG/ML IJ SOLN
12.5000 mg | INTRAMUSCULAR | Status: DC | PRN
Start: 2011-04-16 — End: 2011-04-17

## 2011-04-16 MED ORDER — ZOLPIDEM TARTRATE 10 MG PO TABS: 10.0000 mg | ORAL_TABLET | Freq: Every evening | ORAL | Status: DC | PRN

## 2011-04-16 MED ORDER — ACETAMINOPHEN 325 MG PO TABS: 650.0000 mg | ORAL_TABLET | ORAL | Status: DC | PRN

## 2011-04-16 MED ORDER — FLEET ENEMA 7-19 GM/118ML RE ENEM: 1.0000 | ENEMA | RECTAL | Status: DC | PRN

## 2011-04-16 MED ORDER — FENTANYL 2.5 MCG/ML BUPIVACAINE 1/10 % EPIDURAL INFUSION (WH - ANES)
14.0000 mL/h | INTRAMUSCULAR | Status: DC
  Administered 2011-04-16 (×2): 14 mL/h via EPIDURAL
  Filled 2011-04-16 (×2): qty 60

## 2011-04-16 NOTE — Progress Notes (Signed)
   Krystal Marshall is a 25 y.o. G1P0 at [redacted]w[redacted]d  admitted for active labor, rupture of membranes  Subjective: IV meds helpful  Objective: BP 131/73  Pulse 80  Temp(Src) 98.7 F (37.1 C) (Axillary)  Resp 18  Ht 5\' 4"  (1.626 m)  Wt 85.004 kg (187 lb 6.4 oz)  BMI 32.17 kg/m2  LMP 06/19/2010    FHT:  FHR: 130's bpm, variability: moderate,  accelerations:  Present,  decelerations:  Absent UC:   regular, every 2-3 minutes SVE:   4/90/-2 Labs: Lab Results  Component Value Date   WBC 11.9* 04/16/2011   HGB 9.6* 04/16/2011   HCT 30.2* 04/16/2011   MCV 72.2* 04/16/2011   PLT 309 04/16/2011    Assessment / Plan: Protracted latent phasel will start pitocin  Labor: protracted; prolonged PROM Fetal Wellbeing:  Category I Pain Control:  nubain Anticipated MOD:  NSVD  CRESENZO-DISHMAN,Frady Taddeo 04/16/2011, 12:34 PM

## 2011-04-16 NOTE — Anesthesia Procedure Notes (Signed)

## 2011-04-16 NOTE — Progress Notes (Signed)
   Krystal Marshall is a 25 y.o. G1P0 at [redacted]w[redacted]d  admitted for active labor, rupture of membranes  Subjective: No c/o  Objective: BP 137/86  Pulse 74  Temp(Src) 98.4 F (36.9 C) (Oral)  Resp 18  Ht 5\' 4"  (1.626 m)  Wt 85.004 kg (187 lb 6.4 oz)  BMI 32.17 kg/m2  LMP 06/19/2010    FHT:  FHR: 110-120 bpm, variability: moderate,  accelerations:  Present,  decelerations:  Absent UC:   regular, every 2-3 minutes SVE:   Deferred d/t prolonged ROM Labs: Lab Results  Component Value Date   WBC 11.9* 04/16/2011   HGB 9.6* 04/16/2011   HCT 30.2* 04/16/2011   MCV 72.2* 04/16/2011   PLT 309 04/16/2011    Assessment / Plan: Augmentation of labor, progressing well  Labor: Progressing normally Fetal Wellbeing:  Category I Pain Control:  iv meds Anticipated MOD:  NSVD  CRESENZO-DISHMAN,Anelle Parlow 04/16/2011, 2:25 PM

## 2011-04-16 NOTE — Progress Notes (Signed)
Onset of contractions two days now worse ?leaking fluid.

## 2011-04-16 NOTE — Anesthesia Preprocedure Evaluation (Signed)

## 2011-04-16 NOTE — H&P (Signed)
Krystal Marshall is a 25 y.o. female presenting for active labor at term.   History Patient started having contractions about 2:00 AM this morning. The frequency and strength has been increasing. She notes leakage of fluid 3 days ago that believes was her membranes rupturing. She notes minimal vaginal bleeding.   OB History    Grav Para Term Preterm Abortions TAB SAB Ect Mult Living   1              No past medical history on file. Past Surgical History  Procedure Date  . No past surgeries    No current facility-administered medications on file prior to encounter.   No current outpatient prescriptions on file prior to encounter.     Family History: family history is not on file. Social History:  reports that she has never smoked. She does not have any smokeless tobacco history on file. She reports that she does not drink alcohol or use illicit drugs.  ROS Positive for headache coincidental with contractions; Negative for blurry vision, fever, chills, nausea, vomiting.   Maternal Exam:  Uterine Assessment: Contraction strength is firm.  Contraction duration is 60 seconds. Contraction frequency is regular.      Fetal Exam Fetal Monitor Review: Mode: fetoscope.   Baseline rate: 125.  Variability: moderate (6-25 bpm).   Pattern: accelerations present and no decelerations.    Fetal State Assessment: Category I - tracings are normal.     Physical Exam  Constitutional: She appears well-developed and well-nourished. She appears distressed.  Cardiovascular: Normal rate, regular rhythm, normal heart sounds and intact distal pulses.   Respiratory: Effort normal and breath sounds normal.  GI:       Gravid, non-tender  Musculoskeletal: She exhibits no edema.    Dilation: 3.5 Effacement (%): 80 Cervical Position: Middle Station: -1 Presentation: Vertex  BP 125/71  Pulse 76  Temp(Src) 97.5 F (36.4 C) (Oral)  Resp 16  Ht 5\' 4"  (1.626 m)  Wt 85.004 kg (187 lb 6.4 oz)  BMI  32.17 kg/m2  LMP 06/19/2010  Prenatal labs: ABO, Rh: AB/POS/-- (05/15 1519) Antibody: NEG (05/15 1519) Rubella: >500.0 (05/15 1519) RPR: NON REAC (09/20 1555)  HBsAg: NEGATIVE (05/15 1519)  HIV: NON REACTIVE (09/20 1555)  GBS: NEGATIVE (10/25 1416)  Glucola: 180/134/87 Anatomy U/S:  Normal    Assessment/Plan: Mrs. Porcaro is a 25 year old G1 at term gestation who presents for active labor.  1. Admit to birthing suites under Dr. Despina Hidden.  2. Rupture of Membranes - perform fern test and amnisure to check 3. Pain - Epidural PRN 4. Monitors - continuous toco and FHM   5. Contact Dr. Armen Pickup for delivery   Mat Carne M.D.  04/16/2011, 10:05 AM

## 2011-04-16 NOTE — Progress Notes (Signed)
   Shaneca Azbell is a 25 y.o. G1P0 at [redacted]w[redacted]d  admitted for active labor, rupture of membranes  Subjective: Desires epidural  Objective: BP 140/74  Pulse 68  Temp(Src) 97.8 F (36.6 C) (Oral)  Resp 20  Ht 5\' 4"  (1.626 m)  Wt 85.004 kg (187 lb 6.4 oz)  BMI 32.17 kg/m2  LMP 06/19/2010    FHT:  FHR: 120 bpm, variability: moderate,  accelerations:  Present,  decelerations:  Absent UC:   regular, every 3 minutes SVE:   Dilation: 4 Effacement (%): 80 Station: -2 Exam by:: F. Cres-Dishmon, CNM  Labs: Lab Results  Component Value Date   WBC 11.9* 04/16/2011   HGB 9.6* 04/16/2011   HCT 30.2* 04/16/2011   MCV 72.2* 04/16/2011   PLT 309 04/16/2011    Assessment / Plan: Protracted latent phase  Labor: AROM of forebag with clear fluid Fetal Wellbeing:  Category I Pain Control:  plans epidural Anticipated MOD:  NSVD  CRESENZO-DISHMAN,Yamato Kopf 04/16/2011, 5:55 PM

## 2011-04-16 NOTE — Progress Notes (Signed)
   Krystal Marshall is a 25 y.o. G1P0 at [redacted]w[redacted]d  admitted for active labor, rupture of membranes  Subjective: Denies pressure.  Feels hot  Objective: BP 116/94  Pulse 102  Temp(Src) 98.3 F (36.8 C) (Oral)  Resp 18  Ht 5\' 4"  (1.626 m)  Wt 85.004 kg (187 lb 6.4 oz)  BMI 32.17 kg/m2  LMP 06/19/2010    FHT:  FHR: 120 bpm, variability: moderate,  accelerations:  Present,  decelerations:  Present early/mild variable UC:   regular, every 2-3 minutes SVE:   Dilation: 8 Effacement (%): 90 Station: 0 Exam by:: F. Chres-Dishmon CNM  Labs: Lab Results  Component Value Date   WBC 11.9* 04/16/2011   HGB 9.6* 04/16/2011   HCT 30.2* 04/16/2011   MCV 72.2* 04/16/2011   PLT 309 04/16/2011    Assessment / Plan: Augmentation of labor, progressing well  Labor: Progressing normally Fetal Wellbeing:  Category II Pain Control:  Epidural Anticipated MOD:  NSVD  CRESENZO-DISHMAN,Lyam Provencio 04/16/2011, 8:40 PM

## 2011-04-17 ENCOUNTER — Encounter (HOSPITAL_COMMUNITY): Payer: Self-pay | Admitting: *Deleted

## 2011-04-17 MED ORDER — OXYCODONE-ACETAMINOPHEN 5-325 MG PO TABS: 1.0000 | ORAL_TABLET | ORAL | Status: DC | PRN

## 2011-04-17 MED ORDER — SENNOSIDES-DOCUSATE SODIUM 8.6-50 MG PO TABS
2.0000 | ORAL_TABLET | Freq: Every day | ORAL | Status: DC
  Administered 2011-04-17: 2 via ORAL

## 2011-04-17 MED ORDER — ONDANSETRON HCL 4 MG PO TABS: 4.0000 mg | ORAL_TABLET | ORAL | Status: DC | PRN

## 2011-04-17 MED ORDER — DIPHENHYDRAMINE HCL 25 MG PO CAPS: 25.0000 mg | ORAL_CAPSULE | Freq: Four times a day (QID) | ORAL | Status: DC | PRN

## 2011-04-17 MED ORDER — ONDANSETRON HCL 4 MG/2ML IJ SOLN: 4.0000 mg | INTRAMUSCULAR | Status: DC | PRN

## 2011-04-17 MED ORDER — SIMETHICONE 80 MG PO CHEW: 80.0000 mg | CHEWABLE_TABLET | ORAL | Status: DC | PRN

## 2011-04-17 MED ORDER — ZOLPIDEM TARTRATE 5 MG PO TABS: 5.0000 mg | ORAL_TABLET | Freq: Every evening | ORAL | Status: DC | PRN

## 2011-04-17 MED ORDER — WITCH HAZEL-GLYCERIN EX PADS: 1.0000 "application " | MEDICATED_PAD | CUTANEOUS | Status: DC | PRN

## 2011-04-17 MED ORDER — LANOLIN HYDROUS EX OINT: TOPICAL_OINTMENT | CUTANEOUS | Status: DC | PRN

## 2011-04-17 MED ORDER — PRENATAL PLUS 27-1 MG PO TABS
1.0000 | ORAL_TABLET | Freq: Every day | ORAL | Status: DC
  Administered 2011-04-17 – 2011-04-18 (×2): 1 via ORAL
  Filled 2011-04-17 (×2): qty 1

## 2011-04-17 MED ORDER — BENZOCAINE-MENTHOL 20-0.5 % EX AERO
1.0000 "application " | INHALATION_SPRAY | CUTANEOUS | Status: DC | PRN
  Filled 2011-04-17: qty 56

## 2011-04-17 MED ORDER — DIBUCAINE 1 % RE OINT
1.0000 "application " | TOPICAL_OINTMENT | RECTAL | Status: DC | PRN
  Filled 2011-04-17: qty 28

## 2011-04-17 MED ORDER — BENZOCAINE-MENTHOL 20-0.5 % EX AERO
INHALATION_SPRAY | CUTANEOUS | Status: AC
  Filled 2011-04-17: qty 56

## 2011-04-17 MED ORDER — IBUPROFEN 600 MG PO TABS
600.0000 mg | ORAL_TABLET | Freq: Four times a day (QID) | ORAL | Status: DC
  Administered 2011-04-17 – 2011-04-18 (×7): 600 mg via ORAL
  Filled 2011-04-17 (×8): qty 1

## 2011-04-17 NOTE — Progress Notes (Signed)
Post Partum Day #1, SVD, PROM Subjective: no complaints, up ad lib and tolerating PO; Having some difficulty with latch.   Objective: Blood pressure 104/67, pulse 84, temperature 98.5 F (36.9 C), temperature source Oral, resp. rate 20, height 5\' 4"  (1.626 m), weight 85.004 kg (187 lb 6.4 oz), last menstrual period 06/19/2010, SpO2 98.00%, unknown if currently breastfeeding.  Physical Exam:  General: alert, cooperative and no distress Lochia: appropriate Uterine Fundus: firm DVT Evaluation: No cords or calf tenderness. No significant calf/ankle edema.   Basename 04/16/11 1027  HGB 9.6*  HCT 30.2*    Assessment/Plan: Plan for discharge tomorrow, Breastfeeding, Lactation consult and Contraception condoms   LOS: 1 day   Krystal Marshall 04/17/2011, 7:50 AM

## 2011-04-17 NOTE — Progress Notes (Signed)
UR chart review completed.  

## 2011-04-18 MED ORDER — PRENATAL PLUS 27-1 MG PO TABS: 1.0000 | ORAL_TABLET | Freq: Every day | ORAL | Status: DC

## 2011-04-18 MED ORDER — IBUPROFEN 600 MG PO TABS: 600.0000 mg | ORAL_TABLET | Freq: Four times a day (QID) | ORAL | Status: AC

## 2011-04-18 NOTE — Progress Notes (Signed)
Discussed discharge information with patient using interpretor 7110.  Pt stated that she understood about infant transfer of care.    Raynelle Chary, RN [phone]

## 2011-04-18 NOTE — Discharge Summary (Signed)
  Obstetric Discharge Summary Reason for Admission: onset of labor and rupture of membranes Prenatal Procedures: none and ultrasound Intrapartum Procedures: spontaneous vaginal delivery and use of McRoberts for shoulder dystocia.  Postpartum Procedures: none Complications-Operative and Postpartum: periuretheral laceration Hemoglobin  Date Value Range Status  04/16/2011 9.6* 12.0-15.0 (g/dL) Final     HCT  Date Value Range Status  04/16/2011 30.2* 36.0-46.0 (%) Final    Discharge Diagnoses: Term Pregnancy-delivered  Discharge Information: Date: 04/18/2011 Activity: pelvic rest Diet: routine Medications: PNV and Ibuprofen Condition: stable Instructions: refer to practice specific booklet Discharge to: home Follow-up Information    Follow up with [facility]. Make an appointment in 6 weeks. (Post partum visit)    Contact information:   [address] [place] [address] [phone]          Newborn Data: Live born female  Birth Weight: 7 lb 12.3 oz (3525 g) APGAR: 4, 8  Home with mother this afternoon or tomorrow 04/19/11 due to feeding and PROM x 3 days no antibiotic treatment.Dessa Phi 04/18/2011, 8:24 AM

## 2011-04-18 NOTE — Addendum Note (Signed)
Addendum  created 04/18/11 1043 by Usbaldo Pannone R Bruchy Mikel, MD   Modules edited:Notes Section    

## 2011-04-18 NOTE — Progress Notes (Signed)
  Subjective:    Patient ID: Krystal Marshall, female    DOB: [birth year], 25 y.o.   MRN: [id number] Post Partum Day 2 Subjective: no complaints  Objective: Blood pressure 116/70, pulse 72, temperature 97.5 F (36.4 C), temperature source Oral, resp. rate 18, height 5\' 4"  (1.626 m), weight 187 lb 6.4 oz (85.004 kg), last menstrual period 06/19/2010, SpO2 97.00%, unknown if currently breastfeeding.  Physical Exam:  General: alert, cooperative and no distress Lochia: appropriate Uterine Fundus: firm DVT Evaluation: No evidence of DVT seen on physical exam.   Basename 04/16/11 1027  HGB 9.6*  HCT 30.2*    Assessment/Plan: Discharge home, Lactation consult and Contraception calendar method.    LOS: 2 days   Gwyn Hieronymus 04/18/2011, 8:30 AM    HPI    Review of Systems     Objective:   Physical Exam        Assessment & Plan:

## 2011-04-18 NOTE — Anesthesia Postprocedure Evaluation (Signed)
Anesthesia Post Note  Patient: Krystal Marshall  Procedure(s) Performed: * No procedures listed *  Anesthesia type: Epidural  Patient location: Mother/Baby  Post pain: Pain level controlled  Post assessment: Post-op Vital signs reviewed  Last Vitals:  Filed Vitals:   04/18/11 0805  BP: 116/70  Pulse:   Temp:   Resp:     Post vital signs: Reviewed  Level of consciousness: awake  Complications: No apparent anesthesia complications

## 2011-04-19 ENCOUNTER — Encounter: Payer: Medicaid Other | Admitting: Family Medicine

## 2011-04-24 ENCOUNTER — Encounter (HOSPITAL_COMMUNITY): Payer: Medicaid Other

## 2012-02-18 IMAGING — US US OB FOLLOW-UP
1 series · 13 of 24 positions shown · non-contrast
Comparison: none

[Series 1: us ob follow up · 13 of 24 slices shown]
[im 1/24]
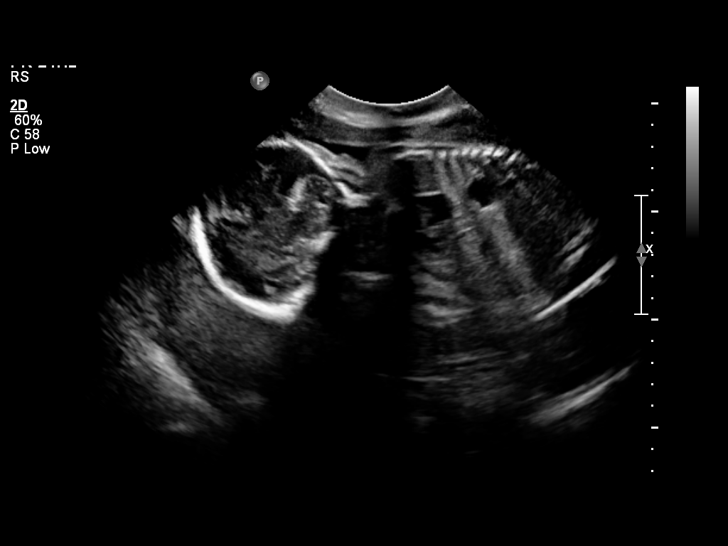
[im 3/24]
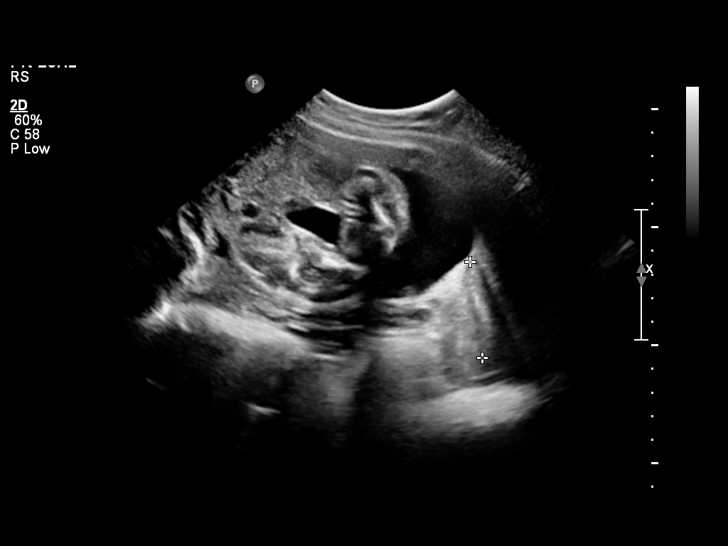
[im 5/24]
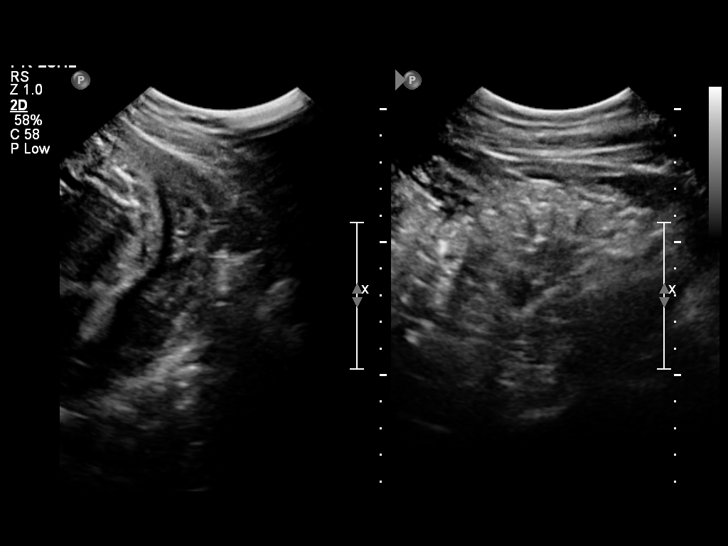
[im 7/24]
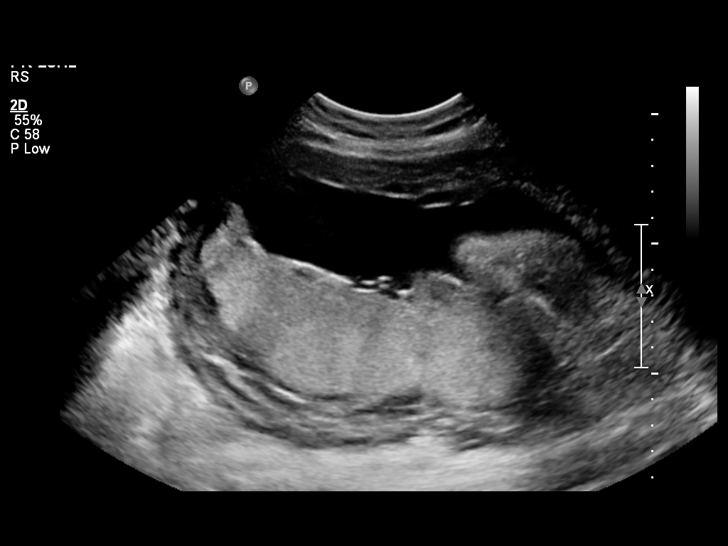
[im 9/24]
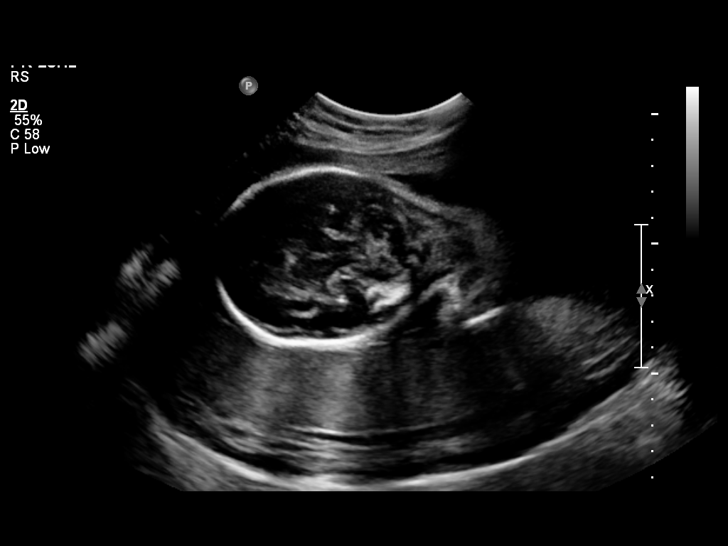
[im 11/24]
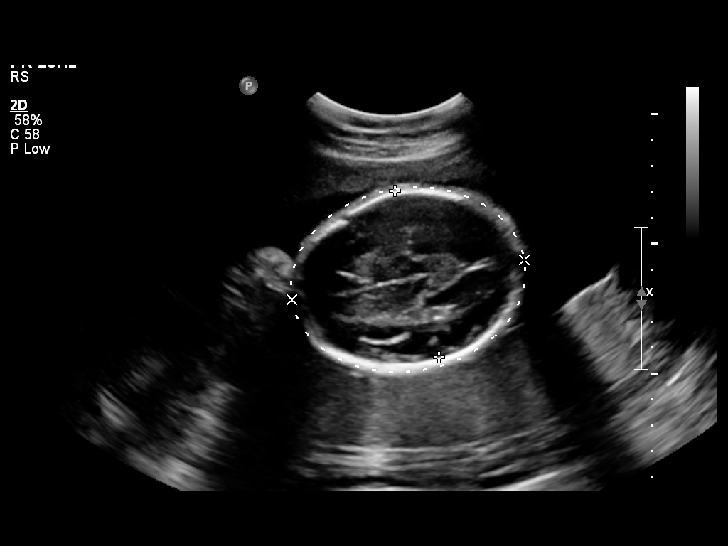
[im 13/24]
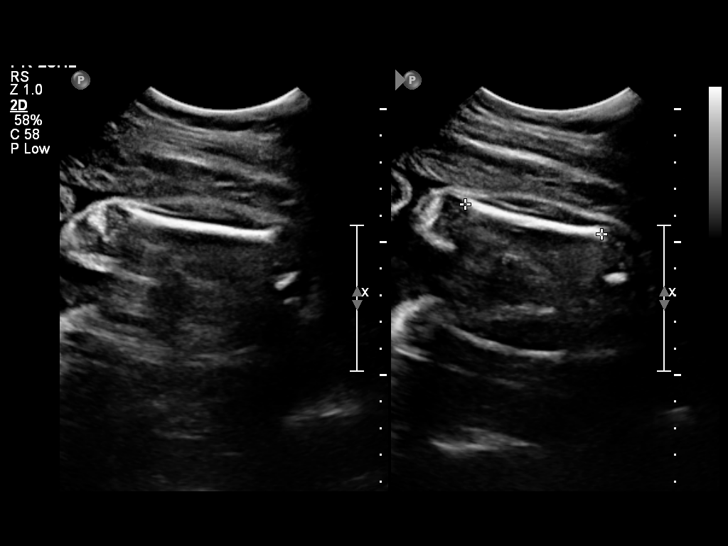
[im 14/24]
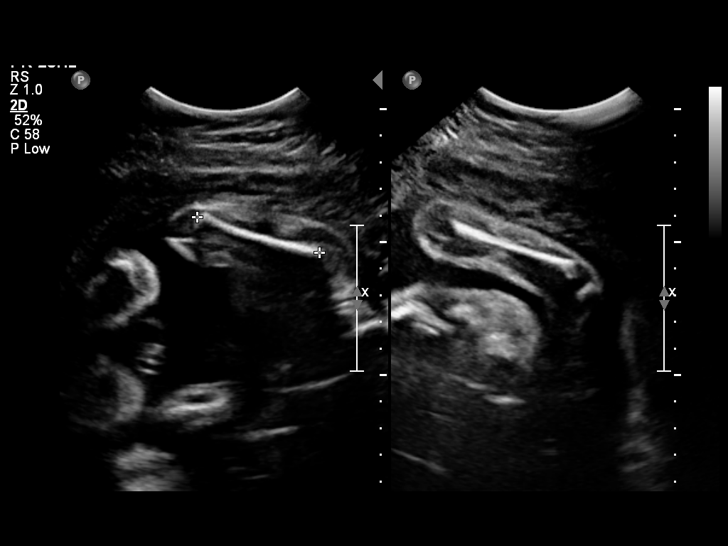
[im 16/24]
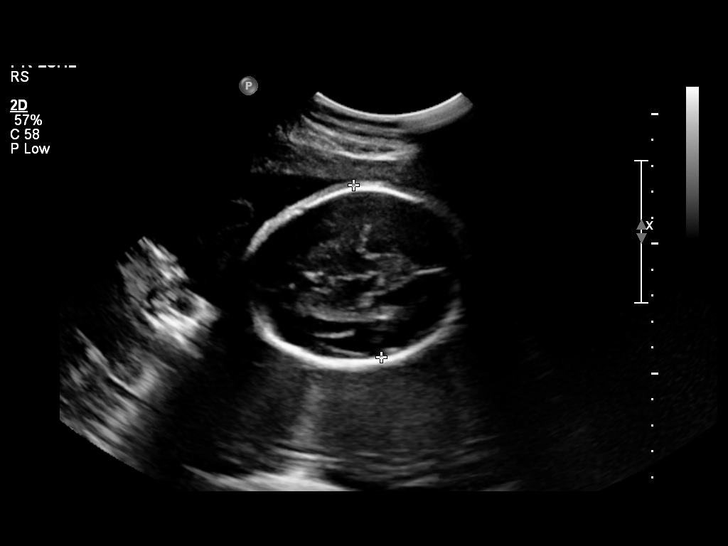
[im 18/24]
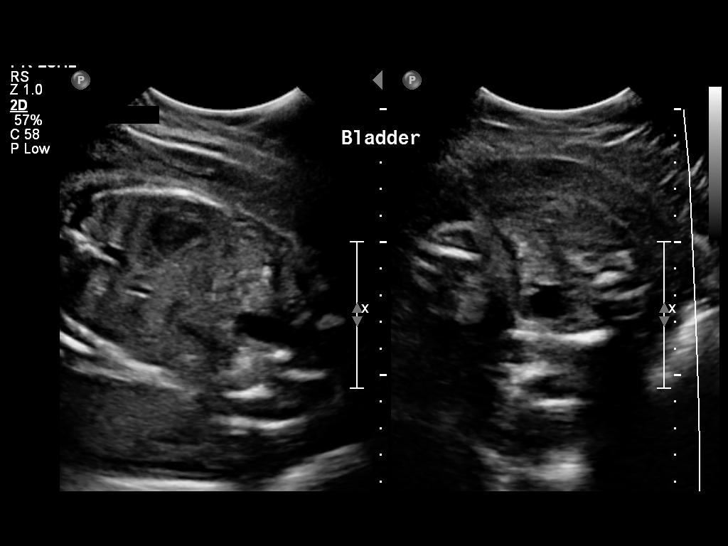
[im 20/24]
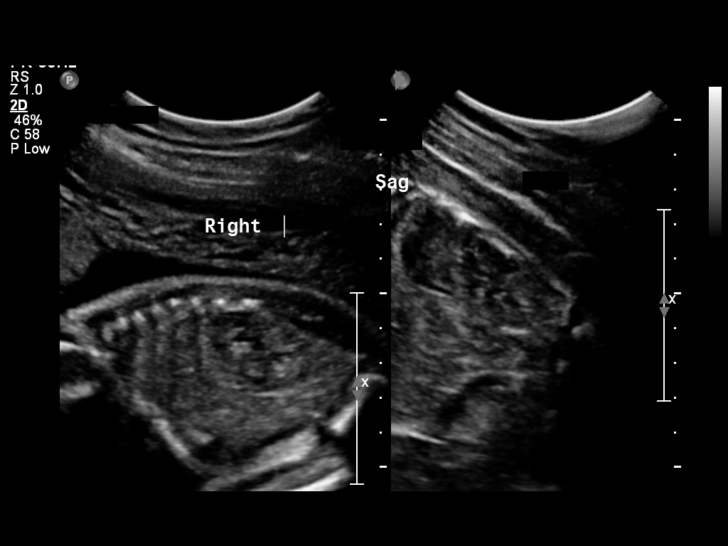
[im 22/24]
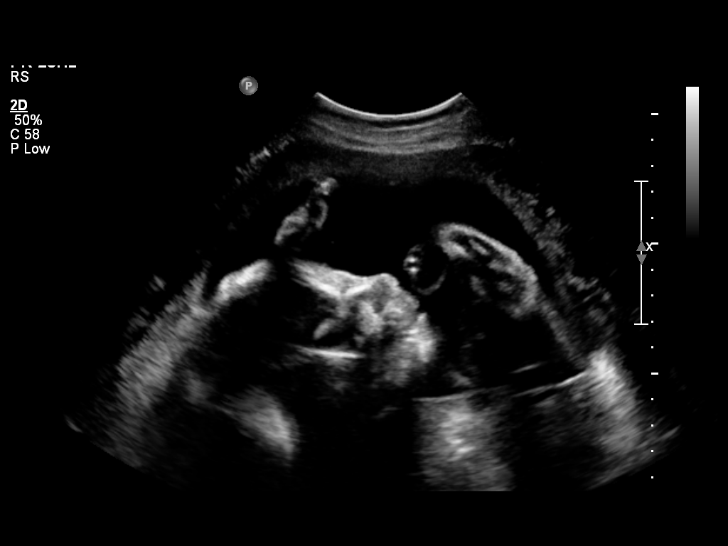
[im 24/24]
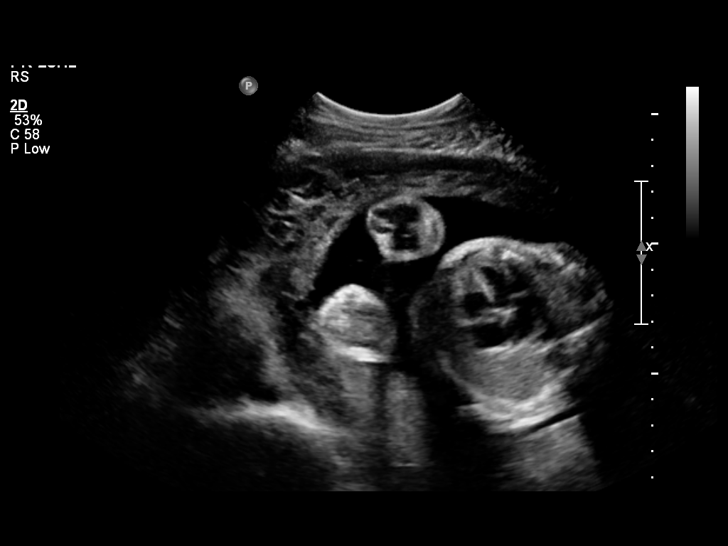

[13 of 24 positions shown; findings below may reference images not displayed]

OBSTETRICS REPORT
                      (Signed Final 01/25/2011 [DATE])

 Order#:         [id number]
Procedures

 US OB FOLLOW UP                                       76816.1
Indications

 Assess Fetal Growth / Estimated Fetal Weight
 Size greater than dates (Large for gestational [AGE]
Fetal Evaluation

 Fetal Heart Rate:  149                          bpm
 Cardiac Activity:  Observed
 Presentation:      Frank breech
 Placenta:          Posterior, above cervical
                    os
 P. Cord            Previously Visualized
 Insertion:

 Amniotic Fluid
 AFI FV:      Subjectively within normal limits
 AFI Sum:     16.64   cm       61  %Tile     Larg Pckt:    5.26  cm
 RUQ:   5.26    cm   RLQ:    4.61   cm    LUQ:   2.93    cm   LLQ:    3.84   cm
Biometry

 BPD:       67  mm     G. Age:  27w 0d                CI:        70.22   70 - 86
                                                      FL/HC:      20.5   18.6 -

 HC:       255  mm     G. Age:  27w 5d       40  %    HC/AC:      1.06   1.05 -

 AC:     240.7  mm     G. Age:  28w 3d       77  %    FL/BPD:     78.2   71 - 87
 FL:      52.4  mm     G. Age:  27w 6d       58  %    FL/AC:      21.8   20 - 24
 HUM:     47.7  mm     G. Age:  28w 0d       66  %

 Est. FW:    2288  gm      2 lb 9 oz     72  %
Gestational Age

 U/S Today:     27w 5d                                        EDD:   04/21/11
 Best:          27w 1d     Det. By:  U/S (11/25/10)           EDD:   04/25/11
Anatomy
 Cranium:           Appears normal      Aortic Arch:       Previously seen
 Fetal Cavum:       Previously seen     Ductal Arch:       Previously seen
 Ventricles:        Appears normal      Diaphragm:         Appears normal
 Choroid Plexus:    Appears normal      Stomach:           Appears normal
 Cerebellum:        Appears normal      Abdomen:           Appears normal
 Posterior Fossa:   Appears normal      Abdominal Wall:    Previously seen
 Nuchal Fold:       Previously seen     Cord Vessels:      Previously seen
 Face:              Previously seen     Kidneys:           Appear normal
 Heart:             Appears normal      Bladder:           Appears normal
                    (4 chamber &
                    axis)
 RVOT:              Previously seen     Spine:             Previously seen
 LVOT:              Previously seen     Limbs:             Previously seen

 Other:     Heels and 5th digit previously seen. Nasal bone
            previously visualized.
Cervix Uterus Adnexa

 Cervical Length:    4.08     cm

 Cervix:       Closed.

 Adnexa:     No abnormality visualized.
Impression

 Single live IUP in breech presentation.   Concordant
 measurements/assigned GA by US.
 No late-developing anomaly in visualized structures above.

 questions or concerns.

## 2014-04-06 ENCOUNTER — Encounter (HOSPITAL_COMMUNITY): Payer: Self-pay | Admitting: *Deleted

## 2014-09-10 ENCOUNTER — Emergency Department (HOSPITAL_COMMUNITY): Payer: Self-pay

## 2014-09-10 ENCOUNTER — Emergency Department (HOSPITAL_COMMUNITY)
Admission: EM | Admit: 2014-09-10 | Discharge: 2014-09-10 | Disposition: A | Discharge status: Home / Self Care | Payer: Self-pay | Attending: Emergency Medicine | Admitting: Emergency Medicine

## 2014-09-10 ENCOUNTER — Encounter (HOSPITAL_COMMUNITY): Payer: Self-pay

## 2014-09-10 DIAGNOSIS — R42 Dizziness and giddiness: Secondary | ICD-10-CM

## 2014-09-10 DIAGNOSIS — I951 Orthostatic hypotension: Secondary | ICD-10-CM

## 2014-09-10 LAB — BASIC METABOLIC PANEL
Anion gap: 8 (ref 5–15)
BUN: 7 mg/dL (ref 6–23)
CO2: 24 mmol/L (ref 19–32)
Calcium: 8.8 mg/dL (ref 8.4–10.5)
Chloride: 108 mmol/L (ref 96–112)
Creatinine, Ser: 0.54 mg/dL (ref 0.50–1.10)
GFR calc Af Amer: >90 mL/min (ref >90)
GFR calc non Af Amer: >90 mL/min (ref >90)
Glucose, Bld: 78 mg/dL (ref 70–99)
Potassium: 3.1 mmol/L — ABNORMAL LOW (ref 3.5–5.1)
Sodium: 140 mmol/L (ref 135–145)

## 2014-09-10 LAB — CBC
HCT: 33.7 % — ABNORMAL LOW (ref 36.0–46.0)
Hemoglobin: 11.5 g/dL — ABNORMAL LOW (ref 12.0–15.0)
MCH: 27.8 pg (ref 26.0–34.0)
MCHC: 34.1 g/dL (ref 30.0–36.0)
MCV: 81.4 fL (ref 78.0–100.0)
Platelets: 247 10*3/uL (ref 150–400)
RBC: 4.14 MIL/uL (ref 3.87–5.11)
RDW: 13.7 % (ref 11.5–15.5)
WBC: 8.3 10*3/uL (ref 4.0–10.5)

## 2014-09-10 LAB — I-STAT TROPONIN, ED: Troponin i, poc: 0 ng/mL (ref 0.00–0.08)

## 2014-09-10 LAB — TROPONIN I: Troponin I: <0.03 ng/mL (ref <0.031)

## 2014-09-10 LAB — POC URINE PREG, ED: Preg Test, Ur: NEGATIVE

## 2014-09-10 MED ORDER — MECLIZINE HCL 50 MG PO TABS
50.0000 mg | ORAL_TABLET | Freq: Two times a day (BID) | ORAL | Status: DC | PRN
Start: 2014-09-10 — End: 2015-06-23

## 2014-09-10 MED ORDER — SODIUM CHLORIDE 0.9 % IV BOLUS (SEPSIS)
1000.0000 mL | Freq: Once | INTRAVENOUS | Status: AC
Start: 2014-09-10 — End: 2014-09-10
  Administered 2014-09-10: 1000 mL via INTRAVENOUS

## 2014-09-10 MED ORDER — POTASSIUM CHLORIDE CRYS ER 20 MEQ PO TBCR
40.0000 meq | EXTENDED_RELEASE_TABLET | Freq: Once | ORAL | Status: AC
  Administered 2014-09-10: 40 meq via ORAL
  Filled 2014-09-10: qty 2

## 2014-09-10 NOTE — ED Notes (Signed)
Pt physically moved from hallway to room A10; pt currently in the bathroom at this time, ambulated with assistance from [place]

## 2014-09-10 NOTE — ED Notes (Signed)
Pt placed back on monitor, continuous pulse oximetry and blood pressure cuff; family at bedside 

## 2014-09-10 NOTE — ED Notes (Signed)
Per EMS, Patient was taking a break at work when she started to have left-sided sharp chest pain with shortness of breath. Patient's pain is intermittent. Patient's heart rate stayed at 70 during episode of pain with EMS. Patient is currently pain free. Patient is alert and oriented upon arrival. Patient has no history of the same. Patient is pre-diabetic. Vitals per EMS: 126/84, 74 HR, 97% on RA, 175 CBG, 18 RR.

## 2014-09-10 NOTE — ED Provider Notes (Signed)
CSN: [id number]     Arrival date & time 09/10/14  1316 History   First MD Initiated Contact with Patient 09/10/14 1504     Chief Complaint  Patient presents with  . Chest Pain     (Consider location/radiation/quality/duration/timing/severity/associated sxs/prior Treatment) HPI   29 year old female brought here via EMS from work with c/o CP.  Pt report for the past 2 weeks she has had recurrent bouts of dizziness which she described as room spinning sensation, lasting for nearly an hr and resolved without specific treatment.  Dizziness sometimes with positional change and sometimes sporadic.  Today when she was at work and was on break approximately 5 hours ago when she experienced the same dizziness sensation, felt lightheadedness, and also having a vague discomfort to her left chest.  The discomfort was not described as a painful sensation, it is nonradiating. She also report heart palpitation, tingling sensation throughout her hands and feet and having SOB.  Sxs lasting approximately 30-40 min and resolved.  However, her manager request pt to come to ER for further evaluation.  Pt currently CP free and sxs free.  She denies fever, chills, headache, neck pain, productive cough, abd pain, back pain, n/v/d.  She denies any strong family hx of cardiac disease, she's not a smoker, no prior hx of PE/DVT, and no significant risk factors for PE.  She does not drink caffeinated drinks or energy drink.  No hx of thyroid disease, does not take any medications on a regular basis. She does admits to having increase stress with work and with family life.  No SI/HI/hallucination.  She request work note to return to work.    History reviewed. No pertinent past medical history. History reviewed. No pertinent past surgical history. No family history on file. History  Substance Use Topics  . Smoking status: Never Smoker   . Smokeless tobacco: Never Used  . Alcohol Use: No   OB History    No data available      Review of Systems  All other systems reviewed and are negative.     Allergies  Review of patient's allergies indicates no known allergies.  Home Medications   Prior to Admission medications   Not on File   BP 105/59 mmHg  Pulse 51  Resp 16  SpO2 98% Physical Exam  Constitutional: She is oriented to person, place, and time. She appears well-developed and well-nourished. No distress.  Awake, alert, nontoxic appearance  HENT:  Head: Atraumatic.  Right Ear: External ear normal.  Left Ear: External ear normal.  Eyes: Conjunctivae are normal. Right eye exhibits no discharge. Left eye exhibits no discharge.  Neck: Neck supple.  No nuchal rigidity  Cardiovascular: Normal rate and regular rhythm.  Exam reveals no gallop and no friction rub.   No murmur heard. Pulmonary/Chest: Effort normal. No respiratory distress. She exhibits no tenderness.  Abdominal: Soft. There is no tenderness. There is no rebound.  Musculoskeletal: She exhibits no tenderness.  ROM appears intact, no obvious focal weakness  Neurological: She is alert and oriented to person, place, and time. She has normal strength. No cranial nerve deficit or sensory deficit. She displays a negative Romberg sign. Coordination and gait normal. GCS eye subscore is 4. GCS verbal subscore is 5. GCS motor subscore is 6.  Mental status and motor strength appears intact  Skin: No rash noted.  Psychiatric: She has a normal mood and affect.  Nursing note and vitals reviewed.   ED Course  Procedures (  including critical care time)  3:23 PM Pt here with c/o dizziness and heart palpations along with vague L chest discomfort.  She is currently symptom free.  HEART score of 1, PERC negative.  No focal neuro deficits on exam.  abnormal orthostatic vital sign.    4:09 PM Evidence of orthostasis which likely responsible for pt's dizziness/lightheadedness.  IVF given.   5:21 PM Negative delta troponin.  Patient is chest pain-free. Pt  felt better after IVF and orthostasis resolved.   5:53 PM Pt back to baseline after receiving IVF.  Recommend f/u with PCP for further care.  Resources provided.  Return precaution discussed.    Labs  Review Labs Reviewed  CBC - Abnormal; Notable for the following:    Hemoglobin 11.5 (*)    HCT 33.7 (*)    All other components within normal limits  BASIC METABOLIC PANEL - Abnormal; Notable for the following:    Potassium 3.1 (*)    All other components within normal limits  TROPONIN I  POC URINE PREG, ED  Rosezena Sensor, ED    Imaging Review Dg Chest 2 View  09/10/2014   CLINICAL DATA:  Sharp left chest pain and shortness of breath today.  EXAM: CHEST  2 VIEW  COMPARISON:  None.  FINDINGS: The heart size and mediastinal contours are within normal limits. There is no focal infiltrate, pulmonary edema, or pleural effusion. The visualized skeletal structures are unremarkable.  IMPRESSION: No active cardiopulmonary disease.   Electronically Signed   By: Sherian Rein M.D.   On: 09/10/2014 14:59     EKG Interpretation   Date/Time:  Thursday September 10 2014 13:45:09 EDT Ventricular Rate:  65 PR Interval:  166 QRS Duration: 122 QT Interval:  414 QTC Calculation: 430 R Axis:   32 Text Interpretation:  Sinus rhythm Nonspecific intraventricular conduction  delay Non-specific ST-t changes No previous ECGs available Confirmed by  Bebe Shaggy  MD, DONALD ([id number]) on 09/10/2014 1:53:09 PM      MDM   Final diagnoses:  Orthostatic hypotension  Dizziness    BP 111/78 mmHg  Pulse 68  Resp 15  SpO2 100%  I have reviewed nursing notes and vital signs. I personally reviewed the imaging tests through PACS system  I reviewed available ER/hospitalization records thought the EMR    Fayrene Helper, PA-C 09/10/14 1800  Benjiman Core, MD 09/10/14 2311

## 2014-09-10 NOTE — ED Notes (Signed)
Pt ambulated from room around nurses' station and back to room without difficulty or distress; pt did state halfway around nurses' station she "feels dizzy like I'm about to pass out"; pt stats were 97%-100%RA with a heart rate of 96-118bpm; Greta DoomBowie, PA notified

## 2014-09-10 NOTE — Discharge Instructions (Signed)
Orthostatic Hypotension Orthostatic hypotension is a sudden drop in blood pressure. It happens when you quickly stand up from a seated or lying position. You may feel dizzy or light-headed. This can last for just a few seconds or for up to a few minutes. It is usually not a serious problem. However, if this happens frequently or gets worse, it can be a sign of something more serious. CAUSES  Different things can cause orthostatic hypotension, including:   Loss of body fluids (dehydration).  Medicines that lower blood pressure.  Sudden changes in posture, such as standing up quickly after you have been sitting or lying down.  Taking too much of your medicine. SIGNS AND SYMPTOMS   Light-headedness or dizziness.   Fainting or near-fainting.   A fast heart rate.   Weakness.   Feeling tired (fatigue).  DIAGNOSIS  Your health care provider may do several things to help diagnose your condition and identify the cause. These may include:   Taking a medical history and doing a physical exam.  Checking your blood pressure. Your health care provider will check your blood pressure when you are:  Lying down.  Sitting.  Standing.  Using tilt table testing. In this test, you lie down on a table that moves from a lying position to a standing position. You will be strapped onto the table. This test monitors your blood pressure and heart rate when you are in different positions. TREATMENT  Treatment will vary depending on the cause. Possible treatments include:   Changing the dosage of your medicines.  Wearing compression stockings on your lower legs.  Standing up slowly after sitting or lying down.  Eating more salt.  Eating frequent, small meals.  In some cases, getting IV fluids.  Taking medicine to enhance fluid retention. HOME CARE INSTRUCTIONS  Only take over-the-counter or prescription medicines as directed by your health care provider.  Follow your health care  provider's instructions for changing the dosage of your current medicines.  Do not stop or adjust your medicine on your own.  Stand up slowly after sitting or lying down. This allows your body to adjust to the different position.  Wear compression stockings as directed.  Eat extra salt as directed.  Do not add extra salt to your diet unless directed to by your health care provider.  Eat frequent, small meals.  Avoid standing suddenly after eating.  Avoid hot showers or excessive heat as directed by your health care provider.  Keep all follow-up appointments. SEEK MEDICAL CARE IF:  You continue to feel dizzy or light-headed after standing.  You feel groggy or confused.  You feel cold, clammy, or sick to your stomach (nauseous).  You have blurred vision.  You feel short of breath. SEEK IMMEDIATE MEDICAL CARE IF:   You faint after standing.  You have chest pain.  You have difficulty breathing.   You lose feeling or movement in your arms or legs.   You have slurred speech or difficulty talking, or you are unable to talk.  MAKE SURE YOU:   Understand these instructions.  Will watch your condition.  Will get help right away if you are not doing well or get worse. Document Released: 05/12/2002 Document Revised: 05/27/2013 Document Reviewed: 03/14/2013 [facility] Patient Information 2015 [employer], [place]. This information is not intended to replace advice given to you by your health care provider. Make sure you discuss any questions you have with your health care provider.   Emergency Department Resource Guide 1)  Find a Doctor and Pay Out of Pocket Although you won't have to find out who is covered by your insurance plan, it is a good idea to ask around and get recommendations. You will then need to call the office and see if the doctor you have chosen will accept you as a new patient and what types of options they offer for patients who are self-pay. Some doctors offer  discounts or will set up payment plans for their patients who do not have insurance, but you will need to ask so you aren't surprised when you get to your appointment.  2) Contact Your Local Health Department Not all health departments have doctors that can see patients for sick visits, but many do, so it is worth a call to see if yours does. If you don't know where your local health department is, you can check in your phone book. The CDC also has a tool to help you locate your state's health department, and many state websites also have listings of all of their local health departments.  3) Find a Walk-in Clinic If your illness is not likely to be very severe or complicated, you may want to try a walk in clinic. These are popping up all over the country in pharmacies, drugstores, and shopping centers. They're usually staffed by nurse practitioners or physician assistants that have been trained to treat common illnesses and complaints. They're usually fairly quick and inexpensive. However, if you have serious medical issues or chronic medical problems, these are probably not your best option.  No Primary Care Doctor: - Call Health Connect at  [phone] - they can help you locate a primary care doctor that  accepts your insurance, provides certain services, etc. - Physician Referral Service- [phone]  Chronic Pain Problems: Organization         Address  Phone   Notes  [facility]  [phone] Patients need to be referred by their primary care doctor.   Medication Assistance: Organization         Address  Phone   Notes  [facility] Medication [facility] [address] [place], [place] [address] [phone] --Must be a resident of [facility] -- Must have NO insurance coverage whatsoever (no Medicaid/ Medicare, etc.) -- The pt. MUST have a primary care doctor that directs their care regularly and follows them in the community   [facility]   [phone]   [employer]  [phone]    Agencies that provide inexpensive medical care: Organization         Address  Phone   Notes  [facility]  [phone]   [facility]    [phone]   [facility] [address] [place], [place] [address] [phone]   [facility] [address] [place], [place] [phone]   Planned Parenthood    [phone]   [facility]    [phone]   [facility]  [address], [place] Phone:  [phone], Fax:  [phone] Hours of Operation:  9 am - 6 pm, M-F.  Also accepts Medicaid/Medicare and self-pay.  [facility]  [address], [place] Phone: [phone], Fax: [phone]. Hours of Operation:  8:30 am - 5:30 pm, M-F.  Also accepts Medicaid and self-pay.  [facility] [place] [address]  Phone: [phone]   [facility] [address] [place], [place] [phone], Ext. [phone] Mondays & Thursdays: 7-9 AM.  First 15 patients are seen on a first come, first serve basis.    Medicaid-accepting [facility] Providers:  Organization         Address  Phone   Notes  [facility] [address], [place] [phone] Also accepts self-pay patients.  [facility] [address] Laurell Josephs [place], [place]  [phone]   [facility] [address], [place] [phone]   [facility] [address] [place] [phone]   Renaye Rakers [address], [place]   [phone] Only accepts [place] Access [place] patients after they have their name applied to their card.   Self-Pay (no insurance) in [facility]:  Organization         Address  Phone   Notes  Sickle Cell Patients, [facility] Internal Medicine [address] [place], [place] [phone]   [facility] [address] [place], [place] [phone]   [facility] [place]  [address],  [phone]   [facility]/Dr. Osei-Bonsu  [address] [place] or [address], [place] [phone] Phone number for both [place] and [place] locations is the same.  [facility] [address] [place] [phone]   [facility] [address] [place] or [address] [phone]   [facility] [address] [place] [phone], phone; [phone], fax Sees patients 1st and 3rd Saturday of every month.  Must not qualify for public or private insurance (i.e. Medicaid, Medicare, [place] Health Choice, Veterans' Benefits)  Household income should be no more than 200% of the poverty level The clinic cannot treat you if you are pregnant or think you are pregnant  Sexually transmitted diseases are not treated at the clinic.    Dental Care: Organization         Address  Phone  Notes  [facility] [address] [place], [place] [phone] Accepts children up to age 30 who are enrolled in [place] or [place] Health Choice; pregnant women with a Medicaid card; and children who have applied for Medicaid or [place] Health Choice, but were declined, whose parents can pay a reduced fee at time of service.  [facility]  [address], [place] [phone] Accepts children up to age 68 who are enrolled in [place] or [place] Health Choice; pregnant women with a Medicaid card; and children who have applied for Medicaid or  Health Choice, but were declined, whose parents can pay a reduced fee at time of service.  [facility]  [address] [place], [place]  [phone] Patients are seen by appointment only. Walk-ins are not accepted. [facility] will see patients 37 years of age and older. Monday - Tuesday (8am-5pm) Most Wednesdays (8:30-5pm) $30 per visit, cash only  [facility] Access [facility]  [address], [facility] [phone] Patients are seen by appointment only. Walk-ins are not accepted. [facility] will see patients 72 years of age and older. One Wednesday Evening (Monthly: Volunteer  Based).  $30 per visit, cash only  [facility]  [phone] for adults; Children under age 49, call Graduate Pediatric Dentistry at [phone]. Children aged 6-14, please call [phone] to request a pediatric application.  Dental services are provided in all areas of dental care including fillings, crowns and bridges, complete and partial dentures, implants, gum treatment, root canals, and extractions. Preventive care is also provided. Treatment is provided to both adults and children. Patients are selected via a lottery and there is often a waiting list.   [facility] [address] [place]  [phone] [email or web address]   [facility] [address] [place], [place] [phone], Ext. [phone] Second and Fourth Thursday of each month, opens at 6:30 AM; Clinic ends at 9 AM.  Patients are seen on a first-come first-served basis, and a limited number are seen during each clinic.   [facility]  [address] [place], [place] [phone]   Eligibility Requirements You must have lived in [place], [place], or [place] counties for at least the last three months.   You cannot be eligible for state or federal sponsored [place], including [employer], [place], or [employer].   You generally cannot be eligible for healthcare insurance through your employer.    How to apply: Eligibility screenings are held every Tuesday and Wednesday  afternoon from 1:00 pm until 4:00 pm. You do not need an appointment for the interview!  [facility] [address] [place], [place] [phone]   [facility] Health Department  [phone]   [facility] Health Department  [phone]   [facility] Health Department  [phone]    Behavioral Health Resources in the Community: Intensive Outpatient Programs Organization         Address  Phone  Notes  [facility] Services [address], [place], [place] [phone]   [facility] [address], [place], [place] [phone]   [facility] [address] [place], [place]  [phone]   [facility] [address],  [place], [place] [phone] or [phone]   Substance Abuse Resources Organization         Address  Phone  Notes  [facility] Drug [facility]  [phone]   [facility]  [phone]   [facility] [place]  [phone]   Floydene Flock  [phone]   Residential & Outpatient Substance Abuse Program  [phone]   Psychological Services Organization         Address  Phone  Notes  [facility] Health  [phone]   [facility] Services  [phone]   [facility] [address] [place] [phone] or [phone]    Mobile Crisis Teams Organization         Address  Phone  Notes  [facility], Mobile Crisis Care Unit  [phone]   [facility]  [address] [place], [place] [phone]   Doristine Locks [address] [place] [phone]    Self-Help/Support Groups Organization         Address  Phone             Notes  [facility] [place] - variety of support groups  336- I7437963 Call for more information  Narcotics Anonymous (NA), [facility] [address], [employer] [place]  2 meetings at this location   Residential Treatment  Programs Organization         Address  Phone  Notes  [facility] [address]    [place]  [phone]   [facility]  [address] [place] [address], [place] [address], [place], [place] [phone]   [facility] [address] [place], [place] [phone] Admissions: 8am-3pm M-F  [facility] [address],    [place], [place] [phone]   [facility] [address] [place], [place], [place] [phone]   [facility] [address],  [place], [place] [phone]   [facility] - Intensive Outpatient [address]ure[address], [place], [place] [phone]   [facility] ([facility] [address] [place].,  [place], [place] [phone] or [phone]   [facility] ([facility]) [address] [place], [place] [phone] Accepts Medicaid  [facility] [address]  [place] [phone] Substance Abuse/Addiction Treatment   [facility] Organization         Address  Phone  Notes  [facility]  [phone]   Angie Fava, PhD [address] [place], [place]   [phone] or [phone]   [facility]   [address] [place], [place] [phone]   [facility] [address] [place], [place] [phone] Insurance/Medicaid/sponsorship through [facility] and Families [address]                                    [place], [place] [phone] Therapy/tele-psych/case  [facility] [address], [place] [phone]    Dr. Lolly Mustache  [phone]   [facility]  [employer] [facility] Dept. 1) [address] [place] 2) [address], [place] 3)  [address], [place] [phone]  [phone]   [facility] Child Abuse Hotline 604-241-9472 or [phone] (After Hours)

## 2014-09-11 ENCOUNTER — Encounter (HOSPITAL_COMMUNITY): Payer: Self-pay | Admitting: *Deleted

## 2014-09-15 ENCOUNTER — Encounter (HOSPITAL_COMMUNITY): Payer: Self-pay | Admitting: Emergency Medicine

## 2014-09-15 ENCOUNTER — Emergency Department (HOSPITAL_COMMUNITY)
Admission: EM | Admit: 2014-09-15 | Discharge: 2014-09-15 | Disposition: A | Discharge status: Home / Self Care | Payer: Self-pay | Attending: Emergency Medicine | Admitting: Emergency Medicine

## 2014-09-15 ENCOUNTER — Emergency Department (INDEPENDENT_AMBULATORY_CARE_PROVIDER_SITE_OTHER)
Admission: EM | Admit: 2014-09-15 | Discharge: 2014-09-15 | Disposition: A | Discharge status: Nursing Home (Includes ICF) | Payer: Self-pay | Source: Home / Self Care

## 2014-09-15 DIAGNOSIS — R0789 Other chest pain: Secondary | ICD-10-CM | POA: Insufficient documentation

## 2014-09-15 DIAGNOSIS — R42 Dizziness and giddiness: Secondary | ICD-10-CM | POA: Insufficient documentation

## 2014-09-15 DIAGNOSIS — Z79899 Other long term (current) drug therapy: Secondary | ICD-10-CM | POA: Insufficient documentation

## 2014-09-15 DIAGNOSIS — R55 Syncope and collapse: Secondary | ICD-10-CM

## 2014-09-15 LAB — I-STAT CHEM 8, ED
BUN: 6 mg/dL (ref 6–23)
Calcium, Ion: 1.19 mmol/L (ref 1.12–1.23)
Chloride: 103 mmol/L (ref 96–112)
Creatinine, Ser: 0.5 mg/dL (ref 0.50–1.10)
Glucose, Bld: 87 mg/dL (ref 70–99)
HCT: 38 % (ref 36.0–46.0)
Hemoglobin: 12.9 g/dL (ref 12.0–15.0)
Potassium: 3.5 mmol/L (ref 3.5–5.1)
Sodium: 140 mmol/L (ref 135–145)
TCO2: 19 mmol/L (ref 0–100)

## 2014-09-15 LAB — I-STAT TROPONIN, ED: Troponin i, poc: 0 ng/mL (ref 0.00–0.08)

## 2014-09-15 LAB — D-DIMER, QUANTITATIVE (NOT AT ARMC): D-Dimer, Quant: 0.45 ug/mL-FEU (ref 0.00–0.48)

## 2014-09-15 MED ORDER — SODIUM CHLORIDE 0.9 % IV SOLN
INTRAVENOUS | Status: DC
  Administered 2014-09-15: 15:00:00 via INTRAVENOUS

## 2014-09-15 MED ORDER — ASPIRIN 81 MG PO CHEW
324.0000 mg | CHEWABLE_TABLET | Freq: Once | ORAL | Status: AC
  Administered 2014-09-15: 324 mg via ORAL

## 2014-09-15 MED ORDER — ASPIRIN 81 MG PO CHEW
CHEWABLE_TABLET | ORAL | Status: AC
  Filled 2014-09-15: qty 4

## 2014-09-15 NOTE — ED Notes (Signed)
Breath sounds clear

## 2014-09-15 NOTE — ED Notes (Signed)
Pt here today with chest pain, dizziness and SOB.  When I walked in the room, she had her eyes closed and was slumped over in the wheelchair.  Pt was Alert to verbal stimuli.  Pt is now sitting up in chair with head on hand.  Pt denies any injury but states she has fallen several times in the last week because of the dizziness.  Person with her today states she has never seen her like this before.

## 2014-09-15 NOTE — ED Notes (Signed)
Patient hyperventilating.  Patient has cramping of hands.  Reported that breathing has been like this since this morning.  Patient crying and hyperventilating.  Unable to move hands, cramping.  History of previous episodes.

## 2014-09-15 NOTE — ED Notes (Signed)
Pt being transported [facility] ED via [facility].  [facility] was called and given report then the Charge RN at the ED was called and given report.  Pt is sitting up in the wheelchair with her friend at the bedside.  She is A&O w/ VSS.

## 2014-09-15 NOTE — ED Provider Notes (Signed)
CSN: [id number]     Arrival date & time 09/15/14  1354 History   None    No chief complaint on file.  (Consider location/radiation/quality/duration/timing/severity/associated sxs/prior Treatment) HPI       29 year old female presents for evaluation of severe chest pain. She also has palpitations, shortness of breath, and feeling very weak. She was seen in the emergency department for this 5 days ago, workup was negative and her symptoms had resolved. They came back soon after she left to have gotten gradually worse. Now they're much too severe so she decided to come in to be seen again, she was driven here by a friend. She complains of "heart hurts." I was called in to assist patient in the room because she was slumping over and she seemed to be on the verge of losing consciousness.  No past medical history on file. Past Surgical History  Procedure Laterality Date  . No past surgeries     No family history on file. History  Substance Use Topics  . Smoking status: Never Smoker   . Smokeless tobacco: Never Used  . Alcohol Use: No   OB History    Gravida Para Term Preterm AB TAB SAB Ectopic Multiple Living   1 1 1  0 0 0 0 0  1     Review of Systems  Respiratory: Positive for shortness of breath.   Cardiovascular: Positive for chest pain and palpitations.  All other systems reviewed and are negative.   Allergies  Review of patient's allergies indicates no known allergies.  Home Medications   Prior to Admission medications   Medication Sig Start Date End Date Taking? Authorizing Provider  meclizine (ANTIVERT) 50 MG tablet Take 1 tablet (50 mg total) by mouth 2 (two) times daily as needed for dizziness or nausea. 09/10/14   Fayrene HelperBowie Tran, PA-C  prenatal vitamin w/FE, FA (PRENATAL 1 + 1) 27-1 MG TABS Take 1 tablet by mouth daily. 04/18/11   Josalyn Funches, MD   BP 124/79 mmHg  Pulse 63  Temp(Src) 98 F (36.7 C) (Oral)  Resp 20  SpO2 100% Physical Exam  Constitutional: She is  oriented to person, place, and time. Vital signs are normal. She appears well-developed and well-nourished. She appears distressed.  Continually slumping over in the chair; she seems unable to focus on me when I ask her questions  HENT:  Head: Normocephalic and atraumatic.  Cardiovascular: Normal rate, regular rhythm, normal heart sounds and intact distal pulses.   Pulmonary/Chest: Effort normal and breath sounds normal. No respiratory distress.  Neurological: She is alert and oriented to person, place, and time. She has normal strength. Coordination normal.  Skin: Skin is warm and dry. No rash noted. She is not diaphoretic.  Psychiatric: She has a normal mood and affect. Judgment normal.  Nursing note and vitals reviewed.   ED Course  ED EKG  Date/Time: 09/15/2014 2:40 PM Performed by: Graylon GoodBAKER, Maisee Vollman H Authorized by: Autumn MessingBAKER, Gardner Servantes H Comparison: compared with previous ECG  Comparison to previous ECG: New incomplete RBBB Rhythm: sinus rhythm Rate: normal QRS axis: normal Conduction: incomplete RBBB ST Segments: ST segments normal T Waves: T waves normal Other: no other findings Clinical impression: abnormal ECG Comments: EKG independently reviewed by me as above   (including critical care time) Labs Review Labs Reviewed - No data to display  Imaging Review No results found.   MDM   1. Other chest pain   2. Near syncope    Patient with chest pain, near  syncope, transferred to the emergency department via EMS for evaluation  Meds ordered this encounter  Medications  . 0.9 %  sodium chloride infusion    Sig:   . aspirin chewable tablet 324 mg    Sig:     EKG shows a new incomplete right bundle branch block     Graylon Good, PA-C 09/15/14 1441

## 2014-09-15 NOTE — ED Provider Notes (Signed)
CSN: [id number]     Arrival date & time 09/15/14  1614 History   First MD Initiated Contact with Patient 09/15/14 1617     Chief Complaint  Patient presents with  . Chest Pain      HPI  Expand All Collapse All   [facility] Pt started having left sided CP this morning. Had a similar episode Thursday as well. Pt stated that if she is sitting down her pain is fine but if she gets up and starts moving then the pain starts back. Pt has had ASA 324mg  BP-124/82 HR-60 Resp-18 O2sat- 100%ra        History reviewed. No pertinent past medical history. Past Surgical History  Procedure Laterality Date  . No past surgeries     No family history on file. History  Substance Use Topics  . Smoking status: Never Smoker   . Smokeless tobacco: Never Used  . Alcohol Use: No   OB History    Gravida Para Term Preterm AB TAB SAB Ectopic Multiple Living   1 1 1  0 0 0 0 0  1     Review of Systems    Allergies  Review of patient's allergies indicates no known allergies.  Home Medications   Prior to Admission medications   Medication Sig Start Date End Date Taking? Authorizing Provider  meclizine (ANTIVERT) 50 MG tablet Take 1 tablet (50 mg total) by mouth 2 (two) times daily as needed for dizziness or nausea. Patient not taking: Reported on 09/15/2014 09/10/14   Fayrene HelperBowie Tran, PA-C  prenatal vitamin w/FE, FA (PRENATAL 1 + 1) 27-1 MG TABS Take 1 tablet by mouth daily. 04/18/11   Josalyn Funches, MD   BP 104/72 mmHg  Pulse 59  Temp(Src) 99.3 F (37.4 C) (Oral)  Resp 17  SpO2 98%  LMP 08/25/2014 (Approximate) Physical Exam Physical Exam  Nursing note and vitals reviewed. Constitutional: She is oriented to person, place, and time. She appears well-developed and well-nourished. No distress.  HENT:  Head: Normocephalic and atraumatic.  Eyes: Pupils are equal, round, and reactive to light.  Neck: Normal range of motion.  Cardiovascular: Normal rate and intact distal  pulses.   Pulmonary/Chest: No respiratory distress.  Abdominal: Normal appearance. She exhibits no distension.  Musculoskeletal: Normal range of motion.  Neurological: She is alert and oriented to person, place, and time. No cranial nerve deficit.  Skin: Skin is warm and dry. No rash noted.  Psychiatric: She has a normal mood and affect. Her behavior is normal.   ED Course  Procedures (including critical care time) Medications - No data to display  Labs Review Labs Reviewed  D-DIMER, QUANTITATIVE  I-STAT CHEM 8, ED  I-STAT TROPOININ, ED    Imaging Review No results found.   EKG Interpretation   Date/Time:  Tuesday September 15 2014 16:15:09 EDT Ventricular Rate:  64 PR Interval:  176 QRS Duration: 120 QT Interval:  431 QTC Calculation: 445 R Axis:   30 Text Interpretation:  Sinus rhythm Nonspecific intraventricular conduction  delay Confirmed by Miklo Aken  MD, Hannah Strader ([id number]) on 09/15/2014 4:41:53 PM     After treatment in the ED the patient feels back to baseline and wants to go home. MDM   Final diagnoses:  Dizziness  Chest discomfort        Nelva Nayobert Harun Brumley, MD 09/18/14 [phone]

## 2014-09-15 NOTE — ED Notes (Signed)
[facility] Pt started having left sided CP this morning. Had a similar episode Thursday as well. Pt stated that if she is sitting down her pain is fine but if she gets up and starts moving then the pain starts back. Pt has had ASA 324mg  BP-124/82 HR-60 Resp-18 O2sat- 100%ra

## 2014-09-15 NOTE — Discharge Instructions (Signed)
I would recommend following up with cardiology for placement of an event monitor    Dizziness  Dizziness means you feel unsteady or lightheaded. You might feel like you are going to pass out (faint). HOME CARE   Drink enough fluids to keep your pee (urine) clear or pale yellow.  Take your medicines exactly as told by your doctor. If you take blood pressure medicine, always stand up slowly from the lying or sitting position. Hold on to something to steady yourself.  If you need to stand in one place for a long time, move your legs often. Tighten and relax your leg muscles.  Have someone stay with you until you feel okay.  Do not drive or use heavy machinery if you feel dizzy.  Do not drink alcohol. GET HELP RIGHT AWAY IF:   You feel dizzy or lightheaded and it gets worse.  You feel sick to your stomach (nauseous), or you throw up (vomit).  You have trouble talking or walking.  You feel weak or have trouble using your arms, hands, or legs.  You cannot think clearly or have trouble forming sentences.  You have chest pain, belly (abdominal) pain, sweating, or you are short of breath.  Your vision changes.  You are bleeding.  You have problems from your medicine that seem to be getting worse. MAKE SURE YOU:   Understand these instructions.  Will watch your condition.  Will get help right away if you are not doing well or get worse. Document Released: 05/11/2011 Document Revised: 08/14/2011 Document Reviewed: 05/11/2011 [facility] Patient Information 2015 [place], [place]. This information is not intended to replace advice given to you by your health care provider. Make sure you discuss any questions you have with your health care provider. Cardiac Event Monitoring A cardiac event monitor is a small recording device used to help detect abnormal heart rhythms (arrhythmias). The monitor is used to record heart rhythm when noticeable symptoms such as the following occur:  Fast  heartbeats (palpitations), such as heart racing or fluttering.  Dizziness.  Fainting or light-headedness.  Unexplained weakness. The monitor is wired to two electrodes placed on your chest. Electrodes are flat, sticky disks that attach to your skin. The monitor can be worn for up to 30 days. You will wear the monitor at all times, except when bathing.  HOW TO USE YOUR CARDIAC EVENT MONITOR A technician will prepare your chest for the electrode placement. The technician will show you how to place the electrodes, how to work the monitor, and how to replace the batteries. Take time to practice using the monitor before you leave the office. Make sure you understand how to send the information from the monitor to your health care provider. This requires a telephone with a landline, not a cell phone. You need to:  Wear your monitor at all times, except when you are in water:  Do not get the monitor wet.  Take the monitor off when bathing. Do not swim or use a hot tub with it on.  Keep your skin clean. Do not put body lotion or moisturizer on your chest.  Change the electrodes daily or any time they stop sticking to your skin. You might need to use tape to keep them on.  It is possible that your skin under the electrodes could become irritated. To keep this from happening, try to put the electrodes in slightly different places on your chest. However, they must remain in the area under your left breast  and in the upper right section of your chest.  Make sure the monitor is safely clipped to your clothing or in a location close to your body that your health care provider recommends.  Press the button to record when you feel symptoms of heart trouble, such as dizziness, weakness, light-headedness, palpitations, thumping, shortness of breath, unexplained weakness, or a fluttering or racing heart. The monitor is always on and records what happened slightly before you pressed the button, so do not worry  about being too late to get good information.  Keep a diary of your activities, such as walking, doing chores, and taking medicine. It is especially important to note what you were doing when you pushed the button to record your symptoms. This will help your health care provider determine what might be contributing to your symptoms. The information stored in your monitor will be reviewed by your health care provider alongside your diary entries.  Send the recorded information as recommended by your health care provider. It is important to understand that it will take some time for your health care provider to process the results.  Change the batteries as recommended by your health care provider. SEEK IMMEDIATE MEDICAL CARE IF:   You have chest pain.  You have extreme difficulty breathing or shortness of breath.  You develop a very fast heartbeat that persists.  You develop dizziness that does not go away.  You faint or constantly feel you are about to faint. Document Released: 02/29/2008 Document Revised: 10/06/2013 Document Reviewed: 11/18/2012 [facility] Patient Information 2015 [place], [place]. This information is not intended to replace advice given to you by your health care provider. Make sure you discuss any questions you have with your health care provider.

## 2015-04-22 ENCOUNTER — Ambulatory Visit (INDEPENDENT_AMBULATORY_CARE_PROVIDER_SITE_OTHER): Payer: Self-pay | Admitting: Physician Assistant

## 2015-04-22 ENCOUNTER — Encounter: Payer: Self-pay | Admitting: Physician Assistant

## 2015-04-22 VITALS — BP 106/59 | HR 71 | Temp 99.1°F | Wt 153.9 lb

## 2015-04-22 DIAGNOSIS — Z1151 Encounter for screening for human papillomavirus (HPV): Secondary | ICD-10-CM

## 2015-04-22 DIAGNOSIS — O0932 Supervision of pregnancy with insufficient antenatal care, second trimester: Secondary | ICD-10-CM

## 2015-04-22 DIAGNOSIS — Z23 Encounter for immunization: Secondary | ICD-10-CM

## 2015-04-22 DIAGNOSIS — Z113 Encounter for screening for infections with a predominantly sexual mode of transmission: Secondary | ICD-10-CM

## 2015-04-22 DIAGNOSIS — N898 Other specified noninflammatory disorders of vagina: Secondary | ICD-10-CM

## 2015-04-22 DIAGNOSIS — Z124 Encounter for screening for malignant neoplasm of cervix: Secondary | ICD-10-CM

## 2015-04-22 DIAGNOSIS — Z3482 Encounter for supervision of other normal pregnancy, second trimester: Secondary | ICD-10-CM | POA: Insufficient documentation

## 2015-04-22 DIAGNOSIS — Z789 Other specified health status: Secondary | ICD-10-CM

## 2015-04-22 DIAGNOSIS — O26892 Other specified pregnancy related conditions, second trimester: Secondary | ICD-10-CM

## 2015-04-22 LAB — POCT URINALYSIS DIP (DEVICE)
Bilirubin Urine: NEGATIVE
Glucose, UA: NEGATIVE mg/dL
Ketones, ur: NEGATIVE mg/dL
Nitrite: NEGATIVE
Protein, ur: NEGATIVE mg/dL
Specific Gravity, Urine: 1.025 (ref 1.005–1.030)
Urobilinogen, UA: 0.2 mg/dL (ref 0.0–1.0)
pH: 6.5 (ref 5.0–8.0)

## 2015-04-22 NOTE — Patient Instructions (Signed)
Prenatal Care °WHAT IS PRENATAL CARE?  °Prenatal care is the process of caring for a pregnant woman before she gives birth. Prenatal care makes sure that she and her baby remain as healthy as possible throughout pregnancy. Prenatal care may be provided by a midwife, family practice health care provider, or a childbirth and pregnancy specialist (obstetrician). Prenatal care may include physical examinations, testing, treatments, and education on nutrition, lifestyle, and social support services. °WHY IS PRENATAL CARE SO IMPORTANT?  °Early and consistent prenatal care increases the chance that you and your baby will remain healthy throughout your pregnancy. This type of care also decreases a baby's risk of being born too early (prematurely), or being born smaller than expected (small for gestational age). Any underlying medical conditions you may have that could pose a risk during your pregnancy are discussed during prenatal care visits. You will also be monitored regularly for any new conditions that may arise during your pregnancy so they can be treated quickly and effectively. °WHAT HAPPENS DURING PRENATAL CARE VISITS? °Prenatal care visits may include the following: °Discussion °Tell your health care provider about any new signs or symptoms you have experienced since your last visit. These might include: °· Nausea or vomiting. °· Increased or decreased level of energy. °· Difficulty sleeping. °· Back or leg pain. °· Weight changes. °· Frequent urination. °· Shortness of breath with physical activity. °· Changes in your skin, such as the development of a rash or itchiness. °· Vaginal discharge or bleeding. °· Feelings of excitement or nervousness. °· Changes in your baby's movements. °You may want to write down any questions or topics you want to discuss with your health care provider and bring them with you to your appointment. °Examination °During your first prenatal care visit, you will likely have a complete  physical exam. Your health care provider will often examine your vagina, cervix, and the position of your uterus, as well as check your heart, lungs, and other body systems. As your pregnancy progresses, your health care provider will measure the size of your uterus and your baby's position inside your uterus. He or she may also examine you for early signs of labor. Your prenatal visits may also include checking your blood pressure and, after about 10-12 weeks of pregnancy, listening to your baby's heartbeat. °Testing °Regular testing often includes: °· Urinalysis. This checks your urine for glucose, protein, or signs of infection. °· Blood count. This checks the levels of white and red blood cells in your body. °· Tests for sexually transmitted infections (STIs). Testing for STIs at the beginning of pregnancy is routinely done and is required in many states. °· Antibody testing. You will be checked to see if you are immune to certain illnesses, such as rubella, that can affect a developing fetus. °· Glucose screen. Around 24-28 weeks of pregnancy, your blood glucose level will be checked for signs of gestational diabetes. Follow-up tests may be recommended. °· Group B strep. This is a bacteria that is commonly found inside a woman's vagina. This test will inform your health care provider if you need an antibiotic to reduce the amount of this bacteria in your body prior to labor and childbirth. °· Ultrasound. Many pregnant women undergo an ultrasound screening around 18-20 weeks of pregnancy to evaluate the health of the fetus and check for any developmental abnormalities. °· HIV (human immunodeficiency virus) testing. Early in your pregnancy, you will be screened for HIV. If you are at high risk for HIV, this test   may be repeated during your third trimester of pregnancy. °You may be offered other testing based on your age, personal or family medical history, or other factors.  °HOW OFTEN SHOULD I PLAN TO SEE MY  HEALTH CARE PROVIDER FOR PRENATAL CARE? °Your prenatal care check-up schedule depends on any medical conditions you have before, or develop during, your pregnancy. If you do not have any underlying medical conditions, you will likely be seen for checkups: °· Monthly, during the first 6 months of pregnancy. °· Twice a month during months 7 and 8 of pregnancy. °· Weekly starting in the 9th month of pregnancy and until delivery. °If you develop signs of early labor or other concerning signs or symptoms, you may need to see your health care provider more often. Ask your health care provider what prenatal care schedule is best for you. °WHAT CAN I DO TO KEEP MYSELF AND MY BABY AS HEALTHY AS POSSIBLE DURING MY PREGNANCY? °· Take a prenatal vitamin containing 400 micrograms (0.4 mg) of folic acid every day. Your health care provider may also ask you to take additional vitamins such as iodine, vitamin D, iron, copper, and zinc. °· Take 1500-2000 mg of calcium daily starting at your 20th week of pregnancy until you deliver your baby. °· Make sure you are up to date on your vaccinations. Unless directed otherwise by your health care provider: °¨ You should receive a tetanus, diphtheria, and pertussis (Tdap) vaccination between the 27th and 36th week of your pregnancy, regardless of when your last Tdap immunization occurred. This helps protect your baby from whooping cough (pertussis) after he or she is born. °¨ You should receive an annual inactivated influenza vaccine (IIV) to help protect you and your baby from influenza. This can be done at any point during your pregnancy. °· Eat a well-rounded diet that includes: °¨ Fresh fruits and vegetables. °¨ Lean proteins. °¨ Calcium-rich foods such as milk, yogurt, hard cheeses, and dark, leafy greens. °¨ Whole grain breads. °· Do not eat seafood high in mercury, including: °¨ Swordfish. °¨ Tilefish. °¨ Shark. °¨ King mackerel. °¨ More than 6 oz tuna per week. °· Do not eat: °¨ Raw  or undercooked meats or eggs. °¨ Unpasteurized foods, such as soft cheeses (brie, blue, or feta), juices, and milks. °¨ Lunch meats. °¨ Hot dogs that have not been heated until they are steaming. °· Drink enough water to keep your urine clear or pale yellow. For many women, this may be 10 or more 8 oz glasses of water each day. Keeping yourself hydrated helps deliver nutrients to your baby and may prevent the start of pre-term uterine contractions. °· Do not use any tobacco products including cigarettes, chewing tobacco, or electronic cigarettes. If you need help quitting, ask your health care provider. °· Do not drink beverages containing alcohol. No safe level of alcohol consumption during pregnancy has been determined. °· Do not use any illegal drugs. These can harm your developing baby or cause a miscarriage. °· Ask your health care provider or pharmacist before taking any prescription or over-the-counter medicines, herbs, or supplements. °· Limit your caffeine intake to no more than 200 mg per day. °· Exercise. Unless told otherwise by your health care provider, try to get 30 minutes of moderate exercise most days of the week. Do not  do high-impact activities, contact sports, or activities with a high risk of falling, such as horseback riding or downhill skiing. °· Get plenty of rest. °· Avoid anything that raises your   body temperature, such as hot tubs and saunas. °· If you own a cat, do not empty its litter box. Bacteria contained in cat feces can cause an infection called toxoplasmosis. This can result in serious harm to the fetus. °· Stay away from chemicals such as insecticides, lead, mercury, and cleaning or paint products that contain solvents. °· Do not have any X-rays taken unless medically necessary. °· Take a childbirth and breastfeeding preparation class. Ask your health care provider if you need a referral or recommendation. °  °This information is not intended to replace advice given to you by  your health care provider. Make sure you discuss any questions you have with your health care provider. °  °Document Released: 05/25/2003 Document Revised: 06/12/2014 Document Reviewed: 08/06/2013 °Elsevier Interactive Patient Education ©2016 [employer] °Second Trimester of Pregnancy °The second trimester is from week 13 through week 28, months 4 through 6. The second trimester is often a time when you feel your best. Your body has also adjusted to being pregnant, and you begin to feel better physically. Usually, morning sickness has lessened or quit completely, you may have more energy, and you may have an increase in appetite. The second trimester is also a time when the fetus is growing rapidly. At the end of the sixth month, the fetus is about 9 inches long and weighs about 1½ pounds. You will likely begin to feel the baby move (quickening) between 18 and 20 weeks of the pregnancy. °BODY CHANGES °Your body goes through many changes during pregnancy. The changes vary from woman to woman.  °· Your weight will continue to increase. You will notice your lower abdomen bulging out. °· You may begin to get stretch marks on your hips, abdomen, and breasts. °· You may develop headaches that can be relieved by medicines approved by your health care provider. °· You may urinate more often because the fetus is pressing on your bladder. °· You may develop or continue to have heartburn as a result of your pregnancy. °· You may develop constipation because certain hormones are causing the muscles that push waste through your intestines to slow down. °· You may develop hemorrhoids or swollen, bulging veins (varicose veins). °· You may have back pain because of the weight gain and pregnancy hormones relaxing your joints between the bones in your pelvis and as a result of a shift in weight and the muscles that support your balance. °· Your breasts will continue to grow and be tender. °· Your gums may bleed and may be sensitive  to brushing and flossing. °· Dark spots or blotches (chloasma, mask of pregnancy) may develop on your face. This will likely fade after the baby is born. °· A dark line from your belly button to the pubic area (linea nigra) may appear. This will likely fade after the baby is born. °· You may have changes in your hair. These can include thickening of your hair, rapid growth, and changes in texture. Some women also have hair loss during or after pregnancy, or hair that feels dry or thin. Your hair will most likely return to normal after your baby is born. °WHAT TO EXPECT AT YOUR PRENATAL VISITS °During a routine prenatal visit: °· You will be weighed to make sure you and the fetus are growing normally. °· Your blood pressure will be taken. °· Your abdomen will be measured to track your baby's growth. °· The fetal heartbeat will be listened to. °· Any test results from the   previous visit will be discussed. °Your health care provider may ask you: °· How you are feeling. °· If you are feeling the baby move. °· If you have had any abnormal symptoms, such as leaking fluid, bleeding, severe headaches, or abdominal cramping. °· If you are using any tobacco products, including cigarettes, chewing tobacco, and electronic cigarettes. °· If you have any questions. °Other tests that may be performed during your second trimester include: °· Blood tests that check for: °¨ Low iron levels (anemia). °¨ Gestational diabetes (between 24 and 28 weeks). °¨ Rh antibodies. °· Urine tests to check for infections, diabetes, or protein in the urine. °· An ultrasound to confirm the proper growth and development of the baby. °· An amniocentesis to check for possible genetic problems. °· Fetal screens for spina bifida and Down syndrome. °· HIV (human immunodeficiency virus) testing. Routine prenatal testing includes screening for HIV, unless you choose not to have this test. °HOME CARE INSTRUCTIONS  °· Avoid all smoking, herbs, alcohol, and  unprescribed drugs. These chemicals affect the formation and growth of the baby. °· Do not use any tobacco products, including cigarettes, chewing tobacco, and electronic cigarettes. If you need help quitting, ask your health care provider. You may receive counseling support and other resources to help you quit. °· Follow your health care provider's instructions regarding medicine use. There are medicines that are either safe or unsafe to take during pregnancy. °· Exercise only as directed by your health care provider. Experiencing uterine cramps is a good sign to stop exercising. °· Continue to eat regular, healthy meals. °· Wear a good support bra for breast tenderness. °· Do not use hot tubs, steam rooms, or saunas. °· Wear your seat belt at all times when driving. °· Avoid raw meat, uncooked cheese, cat litter boxes, and soil used by cats. These carry germs that can cause birth defects in the baby. °· Take your prenatal vitamins. °· Take 1500-2000 mg of calcium daily starting at the 20th week of pregnancy until you deliver your baby. °· Try taking a stool softener (if your health care provider approves) if you develop constipation. Eat more high-fiber foods, such as fresh vegetables or fruit and whole grains. Drink plenty of fluids to keep your urine clear or pale yellow. °· Take warm sitz baths to soothe any pain or discomfort caused by hemorrhoids. Use hemorrhoid cream if your health care provider approves. °· If you develop varicose veins, wear support hose. Elevate your feet for 15 minutes, 3-4 times a day. Limit salt in your diet. °· Avoid heavy lifting, wear low heel shoes, and practice good posture. °· Rest with your legs elevated if you have leg cramps or low back pain. °· Visit your dentist if you have not gone yet during your pregnancy. Use a soft toothbrush to brush your teeth and be gentle when you floss. °· A sexual relationship may be continued unless your health care provider directs you  otherwise. °· Continue to go to all your prenatal visits as directed by your health care provider. °SEEK MEDICAL CARE IF:  °· You have dizziness. °· You have mild pelvic cramps, pelvic pressure, or nagging pain in the abdominal area. °· You have persistent nausea, vomiting, or diarrhea. °· You have a bad smelling vaginal discharge. °· You have pain with urination. °SEEK IMMEDIATE MEDICAL CARE IF:  °· You have a fever. °· You are leaking fluid from your vagina. °· You have spotting or bleeding from your vagina. °· You   have severe abdominal cramping or pain. °· You have rapid weight gain or loss. °· You have shortness of breath with chest pain. °· You notice sudden or extreme swelling of your face, hands, ankles, feet, or legs. °· You have not felt your baby move in over an hour. °· You have severe headaches that do not go away with medicine. °· You have vision changes. °  °This information is not intended to replace advice given to you by your health care provider. Make sure you discuss any questions you have with your health care provider. °  °Document Released: 05/16/2001 Document Revised: 06/12/2014 Document Reviewed: 07/23/2012 °Elsevier Interactive Patient Education ©2016 [employer] ° °

## 2015-04-22 NOTE — Progress Notes (Signed)
   Subjective:    Krystal Marshall is a G2P1001 389w2d being seen today for her first obstetrical visit.  Her obstetrical history is significant for language barrier. Patient does intend to breast feed. Pt would like to be tested for vaginal infection.  Pregnancy history fully reviewed.  Patient reports no complaints.  Filed Vitals:   04/22/15 1326  BP: 106/59  Pulse: 71  Temp: 99.1 F (37.3 C)  Weight: 153 lb 14.4 oz (69.809 kg)    HISTORY: OB History  Gravida Para Term Preterm AB SAB TAB Ectopic Multiple Living  2 1 1  0 0 0 0 0  1    # Outcome Date GA Lbr Len/2nd Weight Sex Delivery Anes PTL Lv  2 Current           1 Term 04/16/11 945w5d 20:11 / 00:58 7 lb 12.3 oz (3.525 kg) M Vag-Spont EPI  Y     History reviewed. No pertinent past medical history. Past Surgical History  Procedure Laterality Date  . No past surgeries     History reviewed. No pertinent family history.   Exam    Uterus:  Fundal Height: 20 cm  Pelvic Exam:    Perineum: No Hemorrhoids, Normal Perineum   Vulva: normal   Vagina:  normal mucosa, wet prep done   pH:    Cervix: no bleeding following Pap   Adnexa: normal adnexa   Bony Pelvis: average  System: Breast:  normal appearance, no masses or tenderness   Skin: normal coloration and turgor, no rashes    Neurologic: oriented, normal mood, grossly non-focal   Extremities: normal strength, tone, and muscle mass   HEENT PERRLA and extra ocular movement intact   Mouth/Teeth mucous membranes moist, pharynx normal without lesions and dental hygiene good   Neck supple and no masses   Cardiovascular: regular rate and rhythm, no murmurs or gallops   Respiratory:  appears well, vitals normal, no respiratory distress, acyanotic, normal RR, ear and throat exam is normal, neck free of mass or lymphadenopathy, chest clear, no wheezing, crepitations, rhonchi, normal symmetric air entry   Abdomen: soft, non-tender; bowel sounds normal; no masses,  no organomegaly   Urinary: urethral meatus normal      Assessment:    Pregnancy: G2P1001 Patient Active Problem List   Diagnosis Date Noted  . Language barrier affecting health care 04/22/2015  . Encounter for supervision of other normal pregnancy in second trimester 04/22/2015        Plan:     Initial labs drawn. Prenatal vitamins. Problem list reviewed and updated. Genetic Screening discussed/  Offer after dating confirmed  Ultrasound discussed; fetal survey: ordered.  Follow up in 4 weeks. 100% of 45 min visit spent on counseling and coordination of care.  Wet prep pending    Bertram DenverKaren E Teague Clark 04/22/2015

## 2015-04-22 NOTE — Progress Notes (Signed)
Language Line Used: [phone]

## 2015-04-23 LAB — PRENATAL PROFILE (SOLSTAS)
Antibody Screen: NEGATIVE
Basophils Absolute: 0 10*3/uL (ref 0.0–0.1)
Basophils Relative: 0 % (ref 0–1)
Eosinophils Absolute: 0.1 10*3/uL (ref 0.0–0.7)
Eosinophils Relative: 1 % (ref 0–5)
HCT: 31.9 % — ABNORMAL LOW (ref 36.0–46.0)
HIV 1&2 Ab, 4th Generation: NONREACTIVE
Hemoglobin: 11 g/dL — ABNORMAL LOW (ref 12.0–15.0)
Hepatitis B Surface Ag: NEGATIVE
Lymphocytes Relative: 26 % (ref 12–46)
Lymphs Abs: 2.8 10*3/uL (ref 0.7–4.0)
MCH: 28.4 pg (ref 26.0–34.0)
MCHC: 34.5 g/dL (ref 30.0–36.0)
MCV: 82.4 fL (ref 78.0–100.0)
MPV: 9.6 fL (ref 8.6–12.4)
Monocytes Absolute: 0.6 10*3/uL (ref 0.1–1.0)
Monocytes Relative: 6 % (ref 3–12)
Neutro Abs: 7.2 10*3/uL (ref 1.7–7.7)
Neutrophils Relative %: 67 % (ref 43–77)
Platelets: 276 10*3/uL (ref 150–400)
RBC: 3.87 MIL/uL (ref 3.87–5.11)
RDW: 14.3 % (ref 11.5–15.5)
Rh Type: POSITIVE
Rubella: 25.8 Index — ABNORMAL HIGH (ref <0.90)
WBC: 10.7 10*3/uL — ABNORMAL HIGH (ref 4.0–10.5)

## 2015-04-23 LAB — CULTURE, OB URINE
Colony Count: NO GROWTH
Organism ID, Bacteria: NO GROWTH

## 2015-04-23 LAB — PRESCRIPTION MONITORING PROFILE (19 PANEL)
Amphetamine/Meth: NEGATIVE ng/mL
Barbiturate Screen, Urine: NEGATIVE ng/mL
Benzodiazepine Screen, Urine: NEGATIVE ng/mL
Buprenorphine, Urine: NEGATIVE ng/mL
Cannabinoid Scrn, Ur: NEGATIVE ng/mL
Carisoprodol, Urine: NEGATIVE ng/mL
Cocaine Metabolites: NEGATIVE ng/mL
Creatinine, Urine: 69.13 mg/dL (ref >20.0)
Fentanyl, Ur: NEGATIVE ng/mL
MDMA URINE: NEGATIVE ng/mL
Meperidine, Ur: NEGATIVE ng/mL
Methadone Screen, Urine: NEGATIVE ng/mL
Methaqualone: NEGATIVE ng/mL
Nitrites, Initial: NEGATIVE ug/mL
Opiate Screen, Urine: NEGATIVE ng/mL
Oxycodone Screen, Ur: NEGATIVE ng/mL
Phencyclidine, Ur: NEGATIVE ng/mL
Propoxyphene: NEGATIVE ng/mL
Tapentadol, urine: NEGATIVE ng/mL
Tramadol Scrn, Ur: NEGATIVE ng/mL
Zolpidem, Urine: NEGATIVE ng/mL
pH, Initial: 6.3 pH (ref 4.5–8.9)

## 2015-04-23 LAB — WET PREP, GENITAL
Clue Cells Wet Prep HPF POC: NONE SEEN
Trich, Wet Prep: NONE SEEN
Yeast Wet Prep HPF POC: NONE SEEN

## 2015-04-26 LAB — CYTOLOGY - PAP

## 2015-05-03 ENCOUNTER — Ambulatory Visit (HOSPITAL_COMMUNITY)
Admission: RE | Admit: 2015-05-03 | Discharge: 2015-05-03 | Disposition: A | Discharge status: Home / Self Care | Payer: Medicaid Other | Source: Ambulatory Visit | Attending: Physician Assistant | Admitting: Physician Assistant

## 2015-05-03 DIAGNOSIS — Z3689 Encounter for other specified antenatal screening: Secondary | ICD-10-CM

## 2015-05-03 DIAGNOSIS — Z36 Encounter for antenatal screening of mother: Secondary | ICD-10-CM | POA: Diagnosis not present

## 2015-05-03 DIAGNOSIS — O0932 Supervision of pregnancy with insufficient antenatal care, second trimester: Secondary | ICD-10-CM

## 2015-05-03 DIAGNOSIS — Z3A20 20 weeks gestation of pregnancy: Secondary | ICD-10-CM | POA: Diagnosis not present

## 2015-05-04 ENCOUNTER — Other Ambulatory Visit: Payer: Self-pay | Admitting: General Practice

## 2015-05-04 DIAGNOSIS — O0932 Supervision of pregnancy with insufficient antenatal care, second trimester: Secondary | ICD-10-CM

## 2015-05-04 DIAGNOSIS — Z3A20 20 weeks gestation of pregnancy: Secondary | ICD-10-CM

## 2015-05-04 DIAGNOSIS — Z3689 Encounter for other specified antenatal screening: Secondary | ICD-10-CM

## 2015-05-20 ENCOUNTER — Encounter: Payer: Self-pay | Admitting: Family

## 2015-05-26 ENCOUNTER — Encounter: Payer: Self-pay | Admitting: Certified Nurse Midwife

## 2015-06-02 ENCOUNTER — Ambulatory Visit (INDEPENDENT_AMBULATORY_CARE_PROVIDER_SITE_OTHER): Payer: Medicaid Other | Admitting: Advanced Practice Midwife

## 2015-06-02 ENCOUNTER — Encounter: Payer: Self-pay | Admitting: Advanced Practice Midwife

## 2015-06-02 VITALS — BP 112/61 | HR 85 | Temp 98.4°F | Wt 164.0 lb

## 2015-06-02 DIAGNOSIS — Z3482 Encounter for supervision of other normal pregnancy, second trimester: Secondary | ICD-10-CM

## 2015-06-02 LAB — POCT URINALYSIS DIP (DEVICE)
Bilirubin Urine: NEGATIVE
Glucose, UA: NEGATIVE mg/dL
Ketones, ur: NEGATIVE mg/dL
Nitrite: NEGATIVE
Protein, ur: NEGATIVE mg/dL
Specific Gravity, Urine: 1.02 (ref 1.005–1.030)
Urobilinogen, UA: 0.2 mg/dL (ref 0.0–1.0)
pH: 7 (ref 5.0–8.0)

## 2015-06-02 NOTE — Progress Notes (Signed)
Subjective:  Krystal Marshall is a 29 y.o. G2P1001 at 3876w0d being seen today for ongoing prenatal care.  She is currently monitored for the following issues for this low-risk pregnancy and has Language barrier affecting health care and Encounter for supervision of other normal pregnancy in second trimester on her problem list.  Patient reports no complaints.  Contractions: Not present. Vag. Bleeding: None.  Movement: Present. Denies leaking of fluid.   The following portions of the patient's history were reviewed and updated as appropriate: allergies, current medications, past family history, past medical history, past social history, past surgical history and problem list. Problem list updated.  Objective:   Filed Vitals:   06/02/15 0950  BP: 112/61  Pulse: 85  Temp: 98.4 F (36.9 C)  Weight: 164 lb (74.39 kg)    Fetal Status: Fetal Heart Rate (bpm): 146   Movement: Present     General:  Alert, oriented and cooperative. Patient is in no acute distress.  Skin: Skin is warm and dry. No rash noted.   Cardiovascular: Normal heart rate noted  Respiratory: Normal respiratory effort, no problems with respiration noted  Abdomen: Soft, gravid, appropriate for gestational age. Pain/Pressure: Absent     Pelvic: Vag. Bleeding: None     Cervical exam deferred        Extremities: Normal range of motion.  Edema: None  Mental Status: Normal mood and affect. Normal behavior. Normal judgment and thought content.   Urinalysis: Urine Protein: Negative Urine Glucose: Negative  Assessment and Plan:  Pregnancy: G2P1001 at 4376w0d  1. Encounter for supervision of other normal pregnancy in second trimester   Preterm labor symptoms and general obstetric precautions including but not limited to vaginal bleeding, contractions, leaking of fluid and fetal movement were reviewed in detail with the patient. Please refer to After Visit Summary for other counseling recommendations.  Return in about 2 weeks (around  06/16/2015).   Dorathy KinsmanVirginia Caitlyne Ingham, CNM

## 2015-06-02 NOTE — Patient Instructions (Addendum)
Preterm Labor Information Preterm labor is when labor starts at less than 37 weeks of pregnancy. The normal length of a pregnancy is 39 to 41 weeks. CAUSES Often, there is no identifiable underlying cause as to why a woman goes into preterm labor. One of the most common known causes of preterm labor is infection. Infections of the uterus, cervix, vagina, amniotic sac, bladder, kidney, or even the lungs (pneumonia) can cause labor to start. Other suspected causes of preterm labor include:   Urogenital infections, such as yeast infections and bacterial vaginosis.   Uterine abnormalities (uterine shape, uterine septum, fibroids, or bleeding from the placenta).   A cervix that has been operated on (it may fail to stay closed).   Malformations in the fetus.   Multiple gestations (twins, triplets, and so on).   Breakage of the amniotic sac.  RISK FACTORS  Having a previous history of preterm labor.   Having premature rupture of membranes (PROM).   Having a placenta that covers the opening of the cervix (placenta previa).   Having a placenta that separates from the uterus (placental abruption).   Having a cervix that is too weak to hold the fetus in the uterus (incompetent cervix).   Having too much fluid in the amniotic sac (polyhydramnios).   Taking illegal drugs or smoking while pregnant.   Not gaining enough weight while pregnant.   Being younger than 18 and older than 31 years old.   Having a low socioeconomic status.   Being African American. SYMPTOMS Signs and symptoms of preterm labor include:   Menstrual-like cramps, abdominal pain, or back pain.  Uterine contractions that are regular, as frequent as six in an hour, regardless of their intensity (may be mild or painful).  Contractions that start on the top of the uterus and spread down to the lower abdomen and back.   A sense of increased pelvic pressure.   A watery or bloody mucus discharge that  comes from the vagina.  TREATMENT Depending on the length of the pregnancy and other circumstances, your health care provider may suggest bed rest. If necessary, there are medicines that can be given to stop contractions and to mature the fetal lungs. If labor happens before 34 weeks of pregnancy, a prolonged hospital stay may be recommended. Treatment depends on the condition of both you and the fetus.  WHAT SHOULD YOU DO IF YOU THINK YOU ARE IN PRETERM LABOR? Call your health care provider right away. You will need to go to the hospital to get checked immediately. HOW CAN YOU PREVENT PRETERM LABOR IN FUTURE PREGNANCIES? You should:   Stop smoking if you smoke.  Maintain healthy weight gain and avoid chemicals and drugs that are not necessary.  Be watchful for any type of infection.  Inform your health care provider if you have a known history of preterm labor.   This information is not intended to replace advice given to you by your health care provider. Make sure you discuss any questions you have with your health care provider.   Document Released: 08/12/2003 Document Revised: 01/22/2013 Document Reviewed: 06/24/2012 Elsevier Interactive Patient Education 2016 Elsevier Inc.  Tdap Vaccine (Tetanus, Diphtheria and Pertussis): What You Need to Know 1. Why get vaccinated? Tetanus, diphtheria and pertussis are very serious diseases. Tdap vaccine can protect us from these diseases. And, Tdap vaccine given to pregnant women can protect newborn babies against pertussis. TETANUS (Lockjaw) is rare in the [place] today. It causes painful muscle tightening and   stiffness, usually all over the body.  It can lead to tightening of muscles in the head and neck so you can't open your mouth, swallow, or sometimes even breathe. Tetanus kills about 1 out of 10 people who are infected even after receiving the best medical care. DIPHTHERIA is also rare in the [place] today. It can cause a  thick coating to form in the back of the throat.  It can lead to breathing problems, heart failure, paralysis, and death. PERTUSSIS (Whooping Cough) causes severe coughing spells, which can cause difficulty breathing, vomiting and disturbed sleep.  It can also lead to weight loss, incontinence, and rib fractures. Up to 2 in 100 adolescents and 5 in 100 adults with pertussis are hospitalized or have complications, which could include pneumonia or death. These diseases are caused by bacteria. Diphtheria and pertussis are spread from person to person through secretions from coughing or sneezing. Tetanus enters the body through cuts, scratches, or wounds. Before vaccines, as many as 200,000 cases of diphtheria, 200,000 cases of pertussis, and hundreds of cases of tetanus, were reported in the [place] each year. Since vaccination began, reports of cases for tetanus and diphtheria have dropped by about 99% and for pertussis by about 80%. 2. Tdap vaccine Tdap vaccine can protect adolescents and adults from tetanus, diphtheria, and pertussis. One dose of Tdap is routinely given at age 11 or 12. People who did not get Tdap at that age should get it as soon as possible. Tdap is especially important for healthcare professionals and anyone having close contact with a baby younger than 12 months. Pregnant women should get a dose of Tdap during every pregnancy, to protect the newborn from pertussis. Infants are most at risk for severe, life-threatening complications from pertussis. Another vaccine, called Td, protects against tetanus and diphtheria, but not pertussis. A Td booster should be given every 10 years. Tdap may be given as one of these boosters if you have never gotten Tdap before. Tdap may also be given after a severe cut or burn to prevent tetanus infection. Your doctor or the person giving you the vaccine can give you more information. Tdap may safely be given at the same time as other  vaccines. 3. Some people should not get this vaccine  A person who has ever had a life-threatening allergic reaction after a previous dose of any diphtheria, tetanus or pertussis containing vaccine, OR has a severe allergy to any part of this vaccine, should not get Tdap vaccine. Tell the person giving the vaccine about any severe allergies.  Anyone who had coma or long repeated seizures within 7 days after a childhood dose of DTP or DTaP, or a previous dose of Tdap, should not get Tdap, unless a cause other than the vaccine was found. They can still get Td.  Talk to your doctor if you:  have seizures or another nervous system problem,  had severe pain or swelling after any vaccine containing diphtheria, tetanus or pertussis,  ever had a condition called Guillain-Barr Syndrome (GBS),  aren't feeling well on the day the shot is scheduled. 4. Risks With any medicine, including vaccines, there is a chance of side effects. These are usually mild and go away on their own. Serious reactions are also possible but are rare. Most people who get Tdap vaccine do not have any problems with it. Mild problems following Tdap (Did not interfere with activities)  Pain where the shot was given (about 3 in 4 adolescents or   2 in 3 adults)  Redness or swelling where the shot was given (about 1 person in 5)  Mild fever of at least 100.4F (up to about 1 in 25 adolescents or 1 in 100 adults)  Headache (about 3 or 4 people in 10)  Tiredness (about 1 person in 3 or 4)  Nausea, vomiting, diarrhea, stomach ache (up to 1 in 4 adolescents or 1 in 10 adults)  Chills, sore joints (about 1 person in 10)  Body aches (about 1 person in 3 or 4)  Rash, swollen glands (uncommon) Moderate problems following Tdap (Interfered with activities, but did not require medical attention)  Pain where the shot was given (up to 1 in 5 or 6)  Redness or swelling where the shot was given (up to about 1 in 16 adolescents or  1 in 12 adults)  Fever over 102F (about 1 in 100 adolescents or 1 in 250 adults)  Headache (about 1 in 7 adolescents or 1 in 10 adults)  Nausea, vomiting, diarrhea, stomach ache (up to 1 or 3 people in 100)  Swelling of the entire arm where the shot was given (up to about 1 in 500). Severe problems following Tdap (Unable to perform usual activities; required medical attention)  Swelling, severe pain, bleeding and redness in the arm where the shot was given (rare). Problems that could happen after any vaccine:  People sometimes faint after a medical procedure, including vaccination. Sitting or lying down for about 15 minutes can help prevent fainting, and injuries caused by a fall. Tell your doctor if you feel dizzy, or have vision changes or ringing in the ears.  Some people get severe pain in the shoulder and have difficulty moving the arm where a shot was given. This happens very rarely.  Any medication can cause a severe allergic reaction. Such reactions from a vaccine are very rare, estimated at fewer than 1 in a million doses, and would happen within a few minutes to a few hours after the vaccination. As with any medicine, there is a very remote chance of a vaccine causing a serious injury or death. The safety of vaccines is always being monitored. For more information, visit: www.cdc.gov/vaccinesafety/ 5. What if there is a serious problem? What should I look for?  Look for anything that concerns you, such as signs of a severe allergic reaction, very high fever, or unusual behavior.  Signs of a severe allergic reaction can include hives, swelling of the face and throat, difficulty breathing, a fast heartbeat, dizziness, and weakness. These would usually start a few minutes to a few hours after the vaccination. What should I do?  If you think it is a severe allergic reaction or other emergency that can't wait, call 9-1-1 or get the person to the nearest hospital. Otherwise, call  your doctor.  Afterward, the reaction should be reported to the Vaccine Adverse Event Reporting System (VAERS). Your doctor might file this report, or you can do it yourself through the VAERS web site at www.vaers.hhs.gov, or by calling 1-800-822-7967. VAERS does not give medical advice.  6. The National Vaccine Injury Compensation Program The National Vaccine Injury Compensation Program (VICP) is a federal program that was created to compensate people who may have been injured by certain vaccines. Persons who believe they may have been injured by a vaccine can learn about the program and about filing a claim by calling 1-800-338-2382 or visiting the VICP website at www.hrsa.gov/vaccinecompensation. There is a time limit to file a   claim for compensation. 7. How can I learn more?  Ask your doctor. He or she can give you the vaccine package insert or suggest other sources of information.  Call your local or state health department.  Contact the Centers for Disease Control and Prevention (CDC):  Call 1-800-232-4636 (1-800-CDC-INFO) or  Visit CDC's website at www.cdc.gov/vaccines CDC Tdap Vaccine VIS (07/29/13)   This information is not intended to replace advice given to you by your health care provider. Make sure you discuss any questions you have with your health care provider.   Document Released: 11/21/2011 Document Revised: 06/12/2014 Document Reviewed: 09/03/2013 Elsevier Interactive Patient Education 2016 Elsevier Inc.  

## 2015-06-02 NOTE — Progress Notes (Signed)
Medicaid Home form completed.

## 2015-06-02 NOTE — Progress Notes (Signed)
Moderate leukocytes noted on urinalysis. 

## 2015-06-06 NOTE — L&D Delivery Note (Signed)
Delivery Note Pt presented with ROM. Her labor progressed normally. Persistent variable decelerations were observed despite positional changes. IUPC was placed and amnioinfusion was performed. At 7:59 PM a viable female was delivered via SVD (Presentation: vertex; occiput anterior ).  APGAR: 5, 8; weight pending.   Placenta status: Intact, Spontaneous.  Cord:  with the following complications: none .  Cord pH: not obtained  Anesthesia: Epidural  Episiotomy:  None Lacerations:  Left and right hemostatic periurethral lacerations that did not require repair Suture Repair: n/a Est. Blood Loss (mL):  150mL  Mom to postpartum.  Baby to Couplet care / Skin to Skin.  Jinny BlossomKaty D Mayo 09/09/2015, 8:20 PM   OB fellow attestation: I was gloved and present for delivery in its entirety.  Second stage of labor progressed.  Variable decelerations throughout 2nd stage. Infant was initially crying with good tone but then tone reduced and infant was transferred to the warmer for evaluation.   Complications: none  Lacerations: per above  EBL: 150  Federico FlakeKimberly Niles Rebie Peale, MD 9:32 PM

## 2015-06-23 ENCOUNTER — Other Ambulatory Visit (HOSPITAL_COMMUNITY)
Admission: RE | Admit: 2015-06-23 | Discharge: 2015-06-23 | Disposition: A | Discharge status: Home / Self Care | Payer: Medicaid Other | Source: Ambulatory Visit | Attending: Physician Assistant | Admitting: Physician Assistant

## 2015-06-23 ENCOUNTER — Ambulatory Visit (INDEPENDENT_AMBULATORY_CARE_PROVIDER_SITE_OTHER): Payer: Medicaid Other | Admitting: Physician Assistant

## 2015-06-23 VITALS — BP 110/58 | HR 85 | Temp 98.4°F | Wt 172.0 lb

## 2015-06-23 DIAGNOSIS — Z3493 Encounter for supervision of normal pregnancy, unspecified, third trimester: Secondary | ICD-10-CM | POA: Diagnosis not present

## 2015-06-23 DIAGNOSIS — Z789 Other specified health status: Secondary | ICD-10-CM

## 2015-06-23 DIAGNOSIS — Z23 Encounter for immunization: Secondary | ICD-10-CM

## 2015-06-23 DIAGNOSIS — Z3482 Encounter for supervision of other normal pregnancy, second trimester: Secondary | ICD-10-CM

## 2015-06-23 DIAGNOSIS — Z113 Encounter for screening for infections with a predominantly sexual mode of transmission: Secondary | ICD-10-CM | POA: Insufficient documentation

## 2015-06-23 DIAGNOSIS — Z603 Acculturation difficulty: Secondary | ICD-10-CM

## 2015-06-23 LAB — CBC
HCT: 31.2 % — ABNORMAL LOW (ref 36.0–46.0)
Hemoglobin: 10.4 g/dL — ABNORMAL LOW (ref 12.0–15.0)
MCH: 27.6 pg (ref 26.0–34.0)
MCHC: 33.3 g/dL (ref 30.0–36.0)
MCV: 82.8 fL (ref 78.0–100.0)
MPV: 9.1 fL (ref 8.6–12.4)
Platelets: 310 10*3/uL (ref 150–400)
RBC: 3.77 MIL/uL — ABNORMAL LOW (ref 3.87–5.11)
RDW: 13.9 % (ref 11.5–15.5)
WBC: 10.2 10*3/uL (ref 4.0–10.5)

## 2015-06-23 LAB — POCT URINALYSIS DIP (DEVICE)
Bilirubin Urine: NEGATIVE
Glucose, UA: NEGATIVE mg/dL
Hgb urine dipstick: NEGATIVE
Ketones, ur: NEGATIVE mg/dL
Nitrite: NEGATIVE
Protein, ur: NEGATIVE mg/dL
Specific Gravity, Urine: 1.02 (ref 1.005–1.030)
Urobilinogen, UA: 0.2 mg/dL (ref 0.0–1.0)
pH: 7 (ref 5.0–8.0)

## 2015-06-23 MED ORDER — TETANUS-DIPHTH-ACELL PERTUSSIS 5-2.5-18.5 LF-MCG/0.5 IM SUSP
0.5000 mL | Freq: Once | INTRAMUSCULAR | Status: AC
  Administered 2015-06-23: 0.5 mL via INTRAMUSCULAR

## 2015-06-23 NOTE — Patient Instructions (Signed)

## 2015-06-23 NOTE — Progress Notes (Signed)
[employer] interpreter [phone] Reviewed tip of week with patient

## 2015-06-23 NOTE — Progress Notes (Signed)
Subjective:  Krystal Marshall is a 30 y.o. G2P1001 at [redacted]w[redacted]d being seen today for ongoing prenatal care.  Patient reports no complaints.  Contractions: Not present.  Vag. Bleeding: None. Movement: Present. Denies leaking of fluid.   The following portions of the patient's history were reviewed and updated as appropriate: allergies, current medications, past family history, past medical history, past social history, past surgical history and problem list.   Objective:   Filed Vitals:   06/23/15 1110  BP: 110/58  Pulse: 85  Temp: 98.4 F (36.9 C)  Weight: 172 lb (78.019 kg)    Fetal Status: Fetal Heart Rate (bpm): 160   Movement: Present     General:  Alert, oriented and cooperative. Patient is in no acute distress.  Skin: Skin is warm and dry. No rash noted.   Cardiovascular: Normal heart rate noted  Respiratory: Normal respiratory effort, no problems with respiration noted  Abdomen: Soft, gravid, appropriate for gestational age. Pain/Pressure: Absent     Pelvic: Vag. Bleeding: None     Cervical exam deferred        Extremities: Normal range of motion.  Edema: None  Mental Status: Normal mood and affect. Normal behavior. Normal judgment and thought content.   Urinalysis:      Assessment and Plan:  Pregnancy: G2P1001 at [redacted]w[redacted]d  1. Language barrier affecting health care - CBC - RPR - HIV antibody (with reflex) - Glucose Tolerance, 1 HR (50g) w/o Fasting - GC/Chlamydia probe amp ([place])not at [facility] - Tdap (BOOSTRIX) injection 0.5 mL; Inject 0.5 mLs into the muscle once.  2. Normal pregnancy, third trimester  - CBC - RPR - HIV antibody (with reflex) - Glucose Tolerance, 1 HR (50g) w/o Fasting - GC/Chlamydia probe amp ([place])not at [facility] - Tdap (BOOSTRIX) injection 0.5 mL; Inject 0.5 mLs into the muscle once.  3. Encounter for supervision of other normal pregnancy in second trimester  Encouraged to take PNV daily.  Pt not presently using and does not want rx.   Encouraged to use and can take Flintstones for kids - 2/day. Preterm labor symptoms and general obstetric precautions including but not limited to vaginal bleeding, contractions, leaking of fluid and fetal movement were reviewed in detail with the patient. Please refer to After Visit Summary for other counseling recommendations.  Return in about 2 weeks (around 07/07/2015) for ob f/u.   Bertram Denver, PA-C

## 2015-06-24 LAB — GLUCOSE TOLERANCE, 1 HOUR (50G) W/O FASTING: Glucose, 1 Hour GTT: 121 mg/dL (ref 70–140)

## 2015-06-24 LAB — GC/CHLAMYDIA PROBE AMP (~~LOC~~) NOT AT ARMC
Chlamydia: NEGATIVE
Neisseria Gonorrhea: NEGATIVE

## 2015-06-24 LAB — RPR

## 2015-06-24 LAB — HIV ANTIBODY (ROUTINE TESTING W REFLEX): HIV 1&2 Ab, 4th Generation: NONREACTIVE

## 2015-06-29 ENCOUNTER — Encounter: Payer: Self-pay | Admitting: Physician Assistant

## 2015-07-07 ENCOUNTER — Ambulatory Visit (INDEPENDENT_AMBULATORY_CARE_PROVIDER_SITE_OTHER): Payer: Medicaid Other | Admitting: Family Medicine

## 2015-07-07 VITALS — BP 117/58 | HR 85 | Temp 98.6°F | Wt 176.3 lb

## 2015-07-07 DIAGNOSIS — Z3482 Encounter for supervision of other normal pregnancy, second trimester: Secondary | ICD-10-CM

## 2015-07-07 DIAGNOSIS — Z789 Other specified health status: Secondary | ICD-10-CM | POA: Diagnosis not present

## 2015-07-07 LAB — POCT URINALYSIS DIP (DEVICE)
Bilirubin Urine: NEGATIVE
Glucose, UA: NEGATIVE mg/dL
Hgb urine dipstick: NEGATIVE
Ketones, ur: NEGATIVE mg/dL
Nitrite: NEGATIVE
Protein, ur: 30 mg/dL — AB
Specific Gravity, Urine: 1.02 (ref 1.005–1.030)
Urobilinogen, UA: 0.2 mg/dL (ref 0.0–1.0)
pH: 5.5 (ref 5.0–8.0)

## 2015-07-07 NOTE — Progress Notes (Signed)
Subjective:  Krystal Marshall is a 30 y.o. G2P1001 at [redacted]w[redacted]d being seen today for ongoing prenatal care.  She is currently monitored for the following issues for this low-risk pregnancy and has Language barrier affecting health care and Encounter for supervision of other normal pregnancy in second trimester on her problem list.  Patient reports no complaints.  Contractions: Not present. Vag. Bleeding: None.  Movement: Present. Denies leaking of fluid.   The following portions of the patient's history were reviewed and updated as appropriate: allergies, current medications, past family history, past medical history, past social history, past surgical history and problem list. Problem list updated.  Objective:   Filed Vitals:   07/07/15 1415  BP: 117/58  Pulse: 85  Temp: 98.6 F (37 C)  Weight: 176 lb 4.8 oz (79.969 kg)    Fetal Status: Fetal Heart Rate (bpm): 154   Movement: Present     General:  Alert, oriented and cooperative. Patient is in no acute distress.  Skin: Skin is warm and dry. No rash noted.   Cardiovascular: Normal heart rate noted  Respiratory: Normal respiratory effort, no problems with respiration noted  Abdomen: Soft, gravid, appropriate for gestational age. Pain/Pressure: Present     Pelvic: Vag. Bleeding: None     Cervical exam deferred        Extremities: Normal range of motion.  Edema: Trace  Mental Status: Normal mood and affect. Normal behavior. Normal judgment and thought content.   Urinalysis: Urine Protein: 1+ Urine Glucose: Negative  Assessment and Plan:  Pregnancy: G2P1001 at [redacted]w[redacted]d  1. Language barrier affecting health care -speaks english  2. Encounter for supervision of other normal pregnancy in second trimester Updated box and reviewed normal preg Recommended tylenol for foot pain  Preterm labor symptoms and general obstetric precautions including but not limited to vaginal bleeding, contractions, leaking of fluid and fetal movement were reviewed  in detail with the patient. Please refer to After Visit Summary for other counseling recommendations.  Return in about 2 weeks (around 07/21/2015) for Routine prenatal care.   Federico Flake, MD

## 2015-07-07 NOTE — Progress Notes (Signed)
Breastfeeding tip of the week reviewed. 

## 2015-07-07 NOTE — Patient Instructions (Addendum)
Safe Medications in Pregnancy   Acne: Benzoyl Peroxide Salicylic Acid  Backache/Headache: Tylenol: 2 regular strength every 4 hours OR              2 Extra strength every 6 hours  Colds/Coughs/Allergies: Benadryl (alcohol free) 25 mg every 6 hours as needed Breath right strips Claritin Cepacol throat lozenges Chloraseptic throat spray Cold-Eeze- up to three times per day Cough drops, alcohol free Flonase (by prescription only) Guaifenesin Mucinex Robitussin DM (plain only, alcohol free) Saline nasal spray/drops Sudafed (pseudoephedrine) & Actifed ** use only after [redacted] weeks gestation and if you do not have high blood pressure Tylenol Vicks Vaporub Zinc lozenges Zyrtec   Constipation: Colace Ducolax suppositories Fleet enema Glycerin suppositories Metamucil Milk of magnesia Miralax Senokot Smooth move tea  Diarrhea: Kaopectate Imodium A-D  *NO pepto Bismol  Hemorrhoids: Anusol Anusol HC Preparation H Tucks  Indigestion: Tums Maalox Mylanta Zantac  Pepcid  Insomnia: Benadryl (alcohol free) 25mg every 6 hours as needed Tylenol PM Unisom, no Gelcaps  Leg Cramps: Tums MagGel  Nausea/Vomiting:  Bonine Dramamine Emetrol Ginger extract Sea bands Meclizine  Nausea medication to take during pregnancy:  Unisom (doxylamine succinate 25 mg tablets) Take one tablet daily at bedtime. If symptoms are not adequately controlled, the dose can be increased to a maximum recommended dose of two tablets daily (1/2 tablet in the morning, 1/2 tablet mid-afternoon and one at bedtime). Vitamin B6 100mg tablets. Take one tablet twice a day (up to 200 mg per day).  Skin Rashes: Aveeno products Benadryl cream or 25mg every 6 hours as needed Calamine Lotion 1% cortisone cream  Yeast infection: Gyne-lotrimin 7 Monistat 7   **If taking multiple medications, please check labels to avoid duplicating the same active ingredients **take medication as directed on  the label ** Do not exceed 4000 mg of tylenol in 24 hours **Do not take medications that contain aspirin or ibuprofen    Third Trimester of Pregnancy The third trimester is from week 29 through week 42, months 7 through 9. The third trimester is a time when the fetus is growing rapidly. At the end of the ninth month, the fetus is about 20 inches in length and weighs 6-10 pounds.  BODY CHANGES Your body goes through many changes during pregnancy. The changes vary from woman to woman.   Your weight will continue to increase. You can expect to gain 25-35 pounds (11-16 kg) by the end of the pregnancy.  You may begin to get stretch marks on your hips, abdomen, and breasts.  You may urinate more often because the fetus is moving lower into your pelvis and pressing on your bladder.  You may develop or continue to have heartburn as a result of your pregnancy.  You may develop constipation because certain hormones are causing the muscles that push waste through your intestines to slow down.  You may develop hemorrhoids or swollen, bulging veins (varicose veins).  You may have pelvic pain because of the weight gain and pregnancy hormones relaxing your joints between the bones in your pelvis. Backaches may result from overexertion of the muscles supporting your posture.  You may have changes in your hair. These can include thickening of your hair, rapid growth, and changes in texture. Some women also have hair loss during or after pregnancy, or hair that feels dry or thin. Your hair will most likely return to normal after your baby is born.  Your breasts will continue to grow and be tender. A yellow discharge   may leak from your breasts called colostrum.  Your belly button may stick out.  You may feel short of breath because of your expanding uterus.  You may notice the fetus "dropping," or moving lower in your abdomen.  You may have a bloody mucus discharge. This usually occurs a few days to  a week before labor begins.  Your cervix becomes thin and soft (effaced) near your due date. WHAT TO EXPECT AT YOUR PRENATAL EXAMS  You will have prenatal exams every 2 weeks until week 36. Then, you will have weekly prenatal exams. During a routine prenatal visit:  You will be weighed to make sure you and the fetus are growing normally.  Your blood pressure is taken.  Your abdomen will be measured to track your baby's growth.  The fetal heartbeat will be listened to.  Any test results from the previous visit will be discussed.  You may have a cervical check near your due date to see if you have effaced. At around 36 weeks, your caregiver will check your cervix. At the same time, your caregiver will also perform a test on the secretions of the vaginal tissue. This test is to determine if a type of bacteria, Group B streptococcus, is present. Your caregiver will explain this further. Your caregiver may ask you:  What your birth plan is.  How you are feeling.  If you are feeling the baby move.  If you have had any abnormal symptoms, such as leaking fluid, bleeding, severe headaches, or abdominal cramping.  If you are using any tobacco products, including cigarettes, chewing tobacco, and electronic cigarettes.  If you have any questions. Other tests or screenings that may be performed during your third trimester include:  Blood tests that check for low iron levels (anemia).  Fetal testing to check the health, activity level, and growth of the fetus. Testing is done if you have certain medical conditions or if there are problems during the pregnancy.  HIV (human immunodeficiency virus) testing. If you are at high risk, you may be screened for HIV during your third trimester of pregnancy. FALSE LABOR You may feel small, irregular contractions that eventually go away. These are called Braxton Hicks contractions, or false labor. Contractions may last for hours, days, or even weeks  before true labor sets in. If contractions come at regular intervals, intensify, or become painful, it is best to be seen by your caregiver.  SIGNS OF LABOR   Menstrual-like cramps.  Contractions that are 5 minutes apart or less.  Contractions that start on the top of the uterus and spread down to the lower abdomen and back.  A sense of increased pelvic pressure or back pain.  A watery or bloody mucus discharge that comes from the vagina. If you have any of these signs before the 37th week of pregnancy, call your caregiver right away. You need to go to the hospital to get checked immediately. HOME CARE INSTRUCTIONS   Avoid all smoking, herbs, alcohol, and unprescribed drugs. These chemicals affect the formation and growth of the baby.  Do not use any tobacco products, including cigarettes, chewing tobacco, and electronic cigarettes. If you need help quitting, ask your health care provider. You may receive counseling support and other resources to help you quit.  Follow your caregiver's instructions regarding medicine use. There are medicines that are either safe or unsafe to take during pregnancy.  Exercise only as directed by your caregiver. Experiencing uterine cramps is a good sign to stop   exercising.  Continue to eat regular, healthy meals.  Wear a good support bra for breast tenderness.  Do not use hot tubs, steam rooms, or saunas.  Wear your seat belt at all times when driving.  Avoid raw meat, uncooked cheese, cat litter boxes, and soil used by cats. These carry germs that can cause birth defects in the baby.  Take your prenatal vitamins.  Take 1500-2000 mg of calcium daily starting at the 20th week of pregnancy until you deliver your baby.  Try taking a stool softener (if your caregiver approves) if you develop constipation. Eat more high-fiber foods, such as fresh vegetables or fruit and whole grains. Drink plenty of fluids to keep your urine clear or pale  yellow.  Take warm sitz baths to soothe any pain or discomfort caused by hemorrhoids. Use hemorrhoid cream if your caregiver approves.  If you develop varicose veins, wear support hose. Elevate your feet for 15 minutes, 3-4 times a day. Limit salt in your diet.  Avoid heavy lifting, wear low heal shoes, and practice good posture.  Rest a lot with your legs elevated if you have leg cramps or low back pain.  Visit your dentist if you have not gone during your pregnancy. Use a soft toothbrush to brush your teeth and be gentle when you floss.  A sexual relationship may be continued unless your caregiver directs you otherwise.  Do not travel far distances unless it is absolutely necessary and only with the approval of your caregiver.  Take prenatal classes to understand, practice, and ask questions about the labor and delivery.  Make a trial run to the hospital.  Pack your hospital bag.  Prepare the baby's nursery.  Continue to go to all your prenatal visits as directed by your caregiver. SEEK MEDICAL CARE IF:  You are unsure if you are in labor or if your water has broken.  You have dizziness.  You have mild pelvic cramps, pelvic pressure, or nagging pain in your abdominal area.  You have persistent nausea, vomiting, or diarrhea.  You have a bad smelling vaginal discharge.  You have pain with urination. SEEK IMMEDIATE MEDICAL CARE IF:   You have a fever.  You are leaking fluid from your vagina.  You have spotting or bleeding from your vagina.  You have severe abdominal cramping or pain.  You have rapid weight loss or gain.  You have shortness of breath with chest pain.  You notice sudden or extreme swelling of your face, hands, ankles, feet, or legs.  You have not felt your baby move in over an hour.  You have severe headaches that do not go away with medicine.  You have vision changes.   This information is not intended to replace advice given to you by your  health care provider. Make sure you discuss any questions you have with your health care provider.   Document Released: 05/16/2001 Document Revised: 06/12/2014 Document Reviewed: 07/23/2012 Elsevier Interactive Patient Education 2016 [employer]  

## 2015-07-21 ENCOUNTER — Encounter: Payer: Self-pay | Admitting: Obstetrics and Gynecology

## 2015-07-21 ENCOUNTER — Ambulatory Visit (INDEPENDENT_AMBULATORY_CARE_PROVIDER_SITE_OTHER): Payer: Medicaid Other | Admitting: Obstetrics and Gynecology

## 2015-07-21 VITALS — BP 106/60 | HR 77 | Temp 98.2°F | Wt 178.5 lb

## 2015-07-21 DIAGNOSIS — Z3483 Encounter for supervision of other normal pregnancy, third trimester: Secondary | ICD-10-CM | POA: Diagnosis not present

## 2015-07-21 LAB — POCT URINALYSIS DIP (DEVICE)
Bilirubin Urine: NEGATIVE
Glucose, UA: NEGATIVE mg/dL
Ketones, ur: NEGATIVE mg/dL
Nitrite: NEGATIVE
Protein, ur: NEGATIVE mg/dL
Specific Gravity, Urine: 1.02 (ref 1.005–1.030)
Urobilinogen, UA: 0.2 mg/dL (ref 0.0–1.0)
pH: 6 (ref 5.0–8.0)

## 2015-07-21 NOTE — Patient Instructions (Signed)

## 2015-07-21 NOTE — Progress Notes (Signed)
Pt has URI sx - cough and stuffy nose.  Breastfeeding tip of the week reviewed.

## 2015-07-21 NOTE — Progress Notes (Signed)
Subjective:  Krystal Marshall is a 30 y.o. G2P1001 at [redacted]w[redacted]d being seen today for ongoing prenatal care.  She is currently monitored for the following issues for this low-risk pregnancy and has Language barrier affecting health care and Encounter for supervision of other normal pregnancy in second trimester on her problem list.  Patient reports no complaints.  Contractions: Not present. Vag. Bleeding: None.  Movement: Present. Denies leaking of fluid.   The following portions of the patient's history were reviewed and updated as appropriate: allergies, current medications, past family history, past medical history, past social history, past surgical history and problem list. Problem list updated.  Objective:   Filed Vitals:   07/21/15 1450  BP: 106/60  Pulse: 77  Temp: 98.2 F (36.8 C)  Weight: 178 lb 8 oz (80.967 kg)    Fetal Status: Fetal Heart Rate (bpm): 152   Movement: Present     General:  Alert, oriented and cooperative. Patient is in no acute distress.  Skin: Skin is warm and dry. No rash noted.   Cardiovascular: Normal heart rate noted  Respiratory: Normal respiratory effort, no problems with respiration noted  Abdomen: Soft, gravid, appropriate for gestational age. Pain/Pressure: Present     Pelvic: Vag. Bleeding: None     Cervical exam deferred        Extremities: Normal range of motion.  Edema: Trace  Mental Status: Normal mood and affect. Normal behavior. Normal judgment and thought content.   Urinalysis: Urine Protein: Negative Urine Glucose: Negative  Assessment and Plan:  Pregnancy: G2P1001 at [redacted]w[redacted]d  1. Encounter for supervision of other normal pregnancy in third trimester Doing well  Preterm labor symptoms and general obstetric precautions including but not limited to vaginal bleeding, contractions, leaking of fluid and fetal movement were reviewed in detail with the patient. Please refer to After Visit Summary for other counseling recommendations.  Return in  about 2 weeks (around 08/04/2015). I  Danae Orleans, CNM

## 2015-08-04 ENCOUNTER — Ambulatory Visit (INDEPENDENT_AMBULATORY_CARE_PROVIDER_SITE_OTHER): Payer: Medicaid Other | Admitting: Family Medicine

## 2015-08-04 VITALS — BP 113/60 | HR 81 | Temp 98.2°F | Wt 181.0 lb

## 2015-08-04 DIAGNOSIS — O26893 Other specified pregnancy related conditions, third trimester: Secondary | ICD-10-CM | POA: Diagnosis not present

## 2015-08-04 DIAGNOSIS — Z789 Other specified health status: Secondary | ICD-10-CM

## 2015-08-04 DIAGNOSIS — Z3482 Encounter for supervision of other normal pregnancy, second trimester: Secondary | ICD-10-CM

## 2015-08-04 DIAGNOSIS — B373 Candidiasis of vulva and vagina: Secondary | ICD-10-CM

## 2015-08-04 DIAGNOSIS — N898 Other specified noninflammatory disorders of vagina: Secondary | ICD-10-CM | POA: Diagnosis not present

## 2015-08-04 DIAGNOSIS — O98813 Other maternal infectious and parasitic diseases complicating pregnancy, third trimester: Secondary | ICD-10-CM

## 2015-08-04 DIAGNOSIS — Z758 Other problems related to medical facilities and other health care: Secondary | ICD-10-CM

## 2015-08-04 DIAGNOSIS — B3731 Acute candidiasis of vulva and vagina: Secondary | ICD-10-CM

## 2015-08-04 LAB — POCT URINALYSIS DIP (DEVICE)
Glucose, UA: NEGATIVE mg/dL
Ketones, ur: 15 mg/dL — AB
Nitrite: NEGATIVE
Protein, ur: 100 mg/dL — AB
Specific Gravity, Urine: 1.025 (ref 1.005–1.030)
Urobilinogen, UA: 0.2 mg/dL (ref 0.0–1.0)
pH: 6.5 (ref 5.0–8.0)

## 2015-08-04 NOTE — Progress Notes (Signed)
Subjective:  Krystal Marshall is a 30 y.o. G2P1001 at [redacted]w[redacted]d being seen today for ongoing prenatal care.  She is currently monitored for the following issues for this low-risk pregnancy and has Language barrier affecting health care and Encounter for supervision of other normal pregnancy in second trimester on her problem list.  Patient reports Yellow vaginal discharge for 1 month, increased amt. No abdominal pain, bleeding.  Contractions: Not present. Vag. Bleeding: None.  Movement: Present. Denies leaking of fluid.   The following portions of the patient's history were reviewed and updated as appropriate: allergies, current medications, past family history, past medical history, past social history, past surgical history and problem list. Problem list updated.  Objective:   Filed Vitals:   08/04/15 1317  BP: 113/60  Pulse: 81  Temp: 98.2 F (36.8 C)  Weight: 181 lb (82.101 kg)    Fetal Status: Fetal Heart Rate (bpm): 161   Movement: Present     General:  Alert, oriented and cooperative. Patient is in no acute distress.  Skin: Skin is warm and dry. No rash noted.   Cardiovascular: Normal heart rate noted  Respiratory: Normal respiratory effort, no problems with respiration noted  Abdomen: Soft, gravid, appropriate for gestational age. Pain/Pressure: Absent     Pelvic: Vag. Bleeding: None     Cervical exam deferred        Extremities: Normal range of motion.  Edema: Trace  Mental Status: Normal mood and affect. Normal behavior. Normal judgment and thought content.   Urinalysis: Urine Protein: 2+ Urine Glucose: Negative  Assessment and Plan:  Pregnancy: G2P1001 at [redacted]w[redacted]d  1. Language barrier affecting health care   2. Encounter for supervision of other normal pregnancy in second trimester Updated flow sheet and box GBS next visit  3. Vaginal discharge - Wet prep collected  Preterm labor symptoms and general obstetric precautions including but not limited to vaginal bleeding,  contractions, leaking of fluid and fetal movement were reviewed in detail with the patient. Please refer to After Visit Summary for other counseling recommendations.  Return in about 2 weeks (around 08/18/2015) for Routine prenatal care.   Federico Flake, MD

## 2015-08-04 NOTE — Addendum Note (Signed)
Addended by: Kathee Delton on: 08/04/2015 02:42 PM   Modules accepted: Orders

## 2015-08-04 NOTE — Patient Instructions (Signed)

## 2015-08-04 NOTE — Progress Notes (Signed)
Please review UA results

## 2015-08-05 LAB — WET PREP, GENITAL
Clue Cells Wet Prep HPF POC: NONE SEEN
Trich, Wet Prep: NONE SEEN

## 2015-08-06 ENCOUNTER — Telehealth: Payer: Self-pay | Admitting: General Practice

## 2015-08-06 MED ORDER — TERCONAZOLE 0.4 % VA CREA: 1.0000 | TOPICAL_CREAM | Freq: Every day | VAGINAL | Status: DC

## 2015-08-06 NOTE — Telephone Encounter (Signed)
Per Dr Alvester MorinNewton, patient's wet prep shows a yeast infection.Terazol cream has been sent to her pharmacy, she will use this for 7 days. Called patient and her listed number and number was not valid. Called patient at husband's number and line was busy

## 2015-08-06 NOTE — Addendum Note (Signed)
Addended by: Geanie BerlinNEWTON, KIMBERLY N on: 08/06/2015 08:47 AM   Modules accepted: Orders

## 2015-08-09 NOTE — Telephone Encounter (Signed)
Attempted to reach patient concerning test results. Home number is not in service Work number is ringing a busy tone.

## 2015-08-12 ENCOUNTER — Telehealth: Payer: Self-pay

## 2015-08-12 NOTE — Telephone Encounter (Signed)
Called patient to inform her that we moved her appt time due to Physician in mandatory meeting and will not be in office after 1:30. I had no success on home phone or on cell phone called twice. Will try to call again.

## 2015-08-17 NOTE — Telephone Encounter (Signed)
Note made on tomorrows visit to inform patient of yeast rx at pharmacy.

## 2015-08-18 ENCOUNTER — Encounter: Payer: Self-pay | Admitting: Obstetrics & Gynecology

## 2015-08-18 ENCOUNTER — Other Ambulatory Visit (HOSPITAL_COMMUNITY)
Admission: RE | Admit: 2015-08-18 | Discharge: 2015-08-18 | Disposition: A | Discharge status: Home / Self Care | Payer: Medicaid Other | Source: Ambulatory Visit | Attending: Obstetrics & Gynecology | Admitting: Obstetrics & Gynecology

## 2015-08-18 ENCOUNTER — Encounter: Payer: Medicaid Other | Admitting: Obstetrics & Gynecology

## 2015-08-18 ENCOUNTER — Ambulatory Visit (INDEPENDENT_AMBULATORY_CARE_PROVIDER_SITE_OTHER): Payer: Medicaid Other | Admitting: Obstetrics & Gynecology

## 2015-08-18 VITALS — BP 117/60 | HR 67 | Temp 98.1°F | Wt 188.0 lb

## 2015-08-18 DIAGNOSIS — Z113 Encounter for screening for infections with a predominantly sexual mode of transmission: Secondary | ICD-10-CM | POA: Insufficient documentation

## 2015-08-18 DIAGNOSIS — Z3482 Encounter for supervision of other normal pregnancy, second trimester: Secondary | ICD-10-CM

## 2015-08-18 DIAGNOSIS — Z603 Acculturation difficulty: Secondary | ICD-10-CM

## 2015-08-18 DIAGNOSIS — Z789 Other specified health status: Secondary | ICD-10-CM

## 2015-08-18 LAB — POCT URINALYSIS DIP (DEVICE)
Bilirubin Urine: NEGATIVE
Glucose, UA: NEGATIVE mg/dL
Ketones, ur: NEGATIVE mg/dL
Nitrite: NEGATIVE
Protein, ur: 100 mg/dL — AB
Specific Gravity, Urine: 1.025 (ref 1.005–1.030)
Urobilinogen, UA: 0.2 mg/dL (ref 0.0–1.0)
pH: 7 (ref 5.0–8.0)

## 2015-08-18 NOTE — Progress Notes (Signed)
Subjective:  Krystal Marshall is a 30 y.o. G2P1001 at 7633w0d being seen today for ongoing prenatal care.  She is currently monitored for the following issues for this low-risk pregnancy and has Language barrier affecting health care and Encounter for supervision of other normal pregnancy in second trimester on her problem list.  Patient reports right breast pain..  Contractions: Not present. Vag. Bleeding: None.  Movement: Present. Denies leaking of fluid.   The following portions of the patient's history were reviewed and updated as appropriate: allergies, current medications, past family history, past medical history, past social history, past surgical history and problem list. Problem list updated.  Objective:   Filed Vitals:   08/18/15 1338  BP: 117/60  Pulse: 67  Temp: 98.1 F (36.7 C)  Weight: 188 lb (85.276 kg)    Fetal Status: Fetal Heart Rate (bpm): 145 Fundal Height: 37 cm Movement: Present  Presentation: Vertex  General:  Alert, oriented and cooperative. Patient is in no acute distress.  Skin: Skin is warm and dry. No rash noted.   Cardiovascular: Normal heart rate noted  Respiratory: Normal respiratory effort, no problems with respiration noted  Abdomen: Soft, gravid, appropriate for gestational age. Pain/Pressure: Present     Pelvic: Vag. Bleeding: None     Cervical exam performed Dilation: Fingertip Effacement (%): Thick Station: Ballotable  Extremities: Normal range of motion.  Edema: None  Mental Status: Normal mood and affect. Normal behavior. Normal judgment and thought content.   Urinalysis: Urine Protein: 2+ Urine Glucose: Negative  Assessment and Plan:  Pregnancy: G2P1001 at 5933w0d  1. Encounter for supervision of other normal pregnancy in second trimester Reviewed labor precautions. GBS obtained today  2. Language barrier affecting health care No interpreter used.  Pt speaks English    Preterm labor symptoms and general obstetric precautions including but  not limited to vaginal bleeding, contractions, leaking of fluid and fetal movement were reviewed in detail with the patient. Please refer to After Visit Summary for other counseling recommendations.  Return in about 1 week (around 08/25/2015).   Krystal Rosenthalarolyn Harraway-Smith, MD

## 2015-08-18 NOTE — Progress Notes (Signed)
Informed patient of yeast infection and medication sent to pharmacy Patient reports pain around right nipple.

## 2015-08-18 NOTE — Progress Notes (Signed)
Large leukocytes on Udip today.

## 2015-08-19 LAB — CULTURE, BETA STREP (GROUP B ONLY)

## 2015-08-20 LAB — GC/CHLAMYDIA PROBE AMP (~~LOC~~) NOT AT ARMC
Chlamydia: NEGATIVE
Neisseria Gonorrhea: NEGATIVE

## 2015-08-24 ENCOUNTER — Ambulatory Visit (INDEPENDENT_AMBULATORY_CARE_PROVIDER_SITE_OTHER): Payer: Medicaid Other | Admitting: Advanced Practice Midwife

## 2015-08-24 VITALS — BP 123/60 | HR 81 | Temp 98.5°F | Wt 190.0 lb

## 2015-08-24 DIAGNOSIS — Z3482 Encounter for supervision of other normal pregnancy, second trimester: Secondary | ICD-10-CM

## 2015-08-24 DIAGNOSIS — L609 Nail disorder, unspecified: Secondary | ICD-10-CM

## 2015-08-24 DIAGNOSIS — Z789 Other specified health status: Secondary | ICD-10-CM

## 2015-08-24 DIAGNOSIS — B353 Tinea pedis: Secondary | ICD-10-CM

## 2015-08-24 DIAGNOSIS — L608 Other nail disorders: Secondary | ICD-10-CM

## 2015-08-24 LAB — POCT URINALYSIS DIP (DEVICE)
Bilirubin Urine: NEGATIVE
Glucose, UA: NEGATIVE mg/dL
Ketones, ur: NEGATIVE mg/dL
Nitrite: NEGATIVE
Protein, ur: 30 mg/dL — AB
Specific Gravity, Urine: 1.02 (ref 1.005–1.030)
Urobilinogen, UA: 0.2 mg/dL (ref 0.0–1.0)
pH: 6.5 (ref 5.0–8.0)

## 2015-08-24 MED ORDER — CLOTRIMAZOLE 1 % EX CREA: 1.0000 "application " | TOPICAL_CREAM | Freq: Two times a day (BID) | CUTANEOUS | Status: DC

## 2015-08-24 NOTE — Progress Notes (Signed)
Interpreter [phone] used for all communication  Subjective:  Krystal Marshall is a 30 y.o. G2P1001 at 6347w6d being seen today for ongoing prenatal care.  She is currently monitored for the following issues for this low-risk pregnancy and has Language barrier affecting health care and Encounter for supervision of other normal pregnancy in second trimester on her problem list.  Patient reports problems with skin of her feet and her toenails.  Contractions: Not present. Vag. Bleeding: None.  Movement: Present. Denies leaking of fluid.   The following portions of the patient's history were reviewed and updated as appropriate: allergies, current medications, past family history, past medical history, past social history, past surgical history and problem list. Problem list updated.  Objective:   Filed Vitals:   08/24/15 1514  BP: 123/60  Pulse: 81  Temp: 98.5 F (36.9 C)  Weight: 190 lb (86.183 kg)    Fetal Status: Fetal Heart Rate (bpm): 141 Fundal Height: 38 cm Movement: Present     General:  Alert, oriented and cooperative. Patient is in no acute distress.  Skin: Skin is warm and dry. No rash noted.   Cardiovascular: Normal heart rate noted  Respiratory: Normal respiratory effort, no problems with respiration noted  Abdomen: Soft, gravid, appropriate for gestational age. Pain/Pressure: Absent     Pelvic: Vag. Bleeding: None     Cervical exam deferred        Extremities: Normal range of motion.  Edema: Trace  Mental Status: Normal mood and affect. Normal behavior. Normal judgment and thought content.   Urinalysis: Urine Protein: 2+ Urine Glucose: Negative  Assessment and Plan:  Pregnancy: G2P1001 at 7247w6d  1. Toenail deformity -Likely toenail fungus.  Discussed use of topical medication and Rx for Lotrimin sent to pharmacy. - Ambulatory referral to Dermatology  2. Tinea pedis of both feet - Discussed use of topical medication and Rx for Lotrimin sent to pharmacy. - Ambulatory  referral to Dermatology  3. Encounter for supervision of other normal pregnancy in second trimester   4. Language barrier affecting health care   Term labor symptoms and general obstetric precautions including but not limited to vaginal bleeding, contractions, leaking of fluid and fetal movement were reviewed in detail with the patient. Please refer to After Visit Summary for other counseling recommendations.  Return in about 1 week (around 08/31/2015).   Hurshel PartyLisa A Leftwich-Kirby, CNM

## 2015-08-24 NOTE — Progress Notes (Signed)
Educated pt on Rooming In  

## 2015-08-31 ENCOUNTER — Encounter: Payer: Medicaid Other | Admitting: Family Medicine

## 2015-09-07 ENCOUNTER — Ambulatory Visit (INDEPENDENT_AMBULATORY_CARE_PROVIDER_SITE_OTHER): Payer: Medicaid Other | Admitting: Family

## 2015-09-07 VITALS — BP 130/69 | HR 84 | Wt 194.2 lb

## 2015-09-07 DIAGNOSIS — R8271 Bacteriuria: Secondary | ICD-10-CM

## 2015-09-07 DIAGNOSIS — Z789 Other specified health status: Secondary | ICD-10-CM | POA: Diagnosis not present

## 2015-09-07 DIAGNOSIS — Z3482 Encounter for supervision of other normal pregnancy, second trimester: Secondary | ICD-10-CM

## 2015-09-07 DIAGNOSIS — Z3483 Encounter for supervision of other normal pregnancy, third trimester: Secondary | ICD-10-CM

## 2015-09-07 LAB — POCT URINALYSIS DIP (DEVICE)
Bilirubin Urine: NEGATIVE
Glucose, UA: NEGATIVE mg/dL
Ketones, ur: NEGATIVE mg/dL
Nitrite: NEGATIVE
Protein, ur: 30 mg/dL — AB
Specific Gravity, Urine: 1.025 (ref 1.005–1.030)
Urobilinogen, UA: 0.2 mg/dL (ref 0.0–1.0)
pH: 7 (ref 5.0–8.0)

## 2015-09-07 NOTE — Progress Notes (Signed)
Subjective:  Krystal Marshall is a 30 y.o. G2P1001 at 2978w6d being seen today for ongoing prenatal care.  She is currently monitored for the following issues for this low-risk pregnancy and has Language barrier affecting health care and Encounter for supervision of other normal pregnancy in second trimester on her problem list.  Patient reports no complaints.  Denies UTI symptoms.    Contractions: Not present. Vag. Bleeding: None.  Movement: Present. Denies leaking of fluid.   The following portions of the patient's history were reviewed and updated as appropriate: allergies, current medications, past family history, past medical history, past social history, past surgical history and problem list. Problem list updated.  Objective:   Filed Vitals:   09/07/15 1110  BP: 130/69  Pulse: 84  Weight: 194 lb 3.2 oz (88.089 kg)    Fetal Status: Fetal Heart Rate (bpm): 144 Fundal Height: 39 cm Movement: Present  Presentation: Vertex  General:  Alert, oriented and cooperative. Patient is in no acute distress.  Skin: Skin is warm and dry. No rash noted.   Cardiovascular: Normal heart rate noted  Respiratory: Normal respiratory effort, no problems with respiration noted  Abdomen: Soft, gravid, appropriate for gestational age. Pain/Pressure: Absent     Pelvic: Vag. Bleeding: None     Cervical exam deferred        Extremities: Normal range of motion.  Edema: Trace  Mental Status: Normal mood and affect. Normal behavior. Normal judgment and thought content.   Urinalysis: Urine Protein: 1+ Urine Glucose: Negative  Assessment and Plan:  Pregnancy: G2P1001 at 7978w6d  1. Language barrier affecting health care   2. Encounter for supervision of other normal pregnancy in second trimester - Continue monitoring; NST in one week  3.  Bacteria in Urine  - Urine culture sent  Term labor symptoms and general obstetric precautions including but not limited to vaginal bleeding, contractions, leaking of fluid  and fetal movement were reviewed in detail with the patient. Please refer to After Visit Summary for other counseling recommendations.  Return in about 8 days (around 09/15/2015) for appt and NST.   Eino FarberWalidah Kennith GainN Karim, CNM

## 2015-09-09 ENCOUNTER — Inpatient Hospital Stay (HOSPITAL_COMMUNITY): Payer: Medicaid Other | Admitting: Anesthesiology

## 2015-09-09 ENCOUNTER — Encounter (HOSPITAL_COMMUNITY): Payer: Self-pay

## 2015-09-09 ENCOUNTER — Inpatient Hospital Stay (HOSPITAL_COMMUNITY)
Admission: AD | Admit: 2015-09-09 | Discharge: 2015-09-11 | DRG: 775 | Disposition: A | Discharge status: Home / Self Care | Payer: Medicaid Other | Source: Ambulatory Visit | Attending: Obstetrics & Gynecology | Admitting: Obstetrics & Gynecology

## 2015-09-09 DIAGNOSIS — O99824 Streptococcus B carrier state complicating childbirth: Secondary | ICD-10-CM | POA: Diagnosis present

## 2015-09-09 DIAGNOSIS — O4202 Full-term premature rupture of membranes, onset of labor within 24 hours of rupture: Principal | ICD-10-CM | POA: Diagnosis present

## 2015-09-09 DIAGNOSIS — Z3A39 39 weeks gestation of pregnancy: Secondary | ICD-10-CM

## 2015-09-09 DIAGNOSIS — IMO0001 Reserved for inherently not codable concepts without codable children: Secondary | ICD-10-CM

## 2015-09-09 LAB — OB RESULTS CONSOLE GBS: GBS: POSITIVE

## 2015-09-09 LAB — TYPE AND SCREEN
ABO/RH(D): AB POS
Antibody Screen: NEGATIVE

## 2015-09-09 LAB — CBC
HCT: 33.4 % — ABNORMAL LOW (ref 36.0–46.0)
Hemoglobin: 11.3 g/dL — ABNORMAL LOW (ref 12.0–15.0)
MCH: 26.2 pg (ref 26.0–34.0)
MCHC: 33.8 g/dL (ref 30.0–36.0)
MCV: 77.3 fL — ABNORMAL LOW (ref 78.0–100.0)
Platelets: 320 10*3/uL (ref 150–400)
RBC: 4.32 MIL/uL (ref 3.87–5.11)
RDW: 15.6 % — ABNORMAL HIGH (ref 11.5–15.5)
WBC: 12.2 10*3/uL — ABNORMAL HIGH (ref 4.0–10.5)

## 2015-09-09 LAB — ABO/RH: ABO/RH(D): AB POS

## 2015-09-09 LAB — POCT FERN TEST: POCT Fern Test: POSITIVE

## 2015-09-09 MED ORDER — ONDANSETRON HCL 4 MG/2ML IJ SOLN: 4.0000 mg | Freq: Four times a day (QID) | INTRAMUSCULAR | Status: DC | PRN

## 2015-09-09 MED ORDER — DIPHENHYDRAMINE HCL 50 MG/ML IJ SOLN: 12.5000 mg | INTRAMUSCULAR | Status: DC | PRN

## 2015-09-09 MED ORDER — ONDANSETRON HCL 4 MG PO TABS: 4.0000 mg | ORAL_TABLET | ORAL | Status: DC | PRN

## 2015-09-09 MED ORDER — FLEET ENEMA 7-19 GM/118ML RE ENEM: 1.0000 | ENEMA | RECTAL | Status: DC | PRN

## 2015-09-09 MED ORDER — PHENYLEPHRINE 40 MCG/ML (10ML) SYRINGE FOR IV PUSH (FOR BLOOD PRESSURE SUPPORT)
80.0000 ug | PREFILLED_SYRINGE | INTRAVENOUS | Status: DC | PRN
  Filled 2015-09-09: qty 2
  Filled 2015-09-09: qty 20

## 2015-09-09 MED ORDER — DEXTROSE 5 % IV SOLN
2.5000 10*6.[IU] | INTRAVENOUS | Status: DC
  Administered 2015-09-09 (×2): 2.5 10*6.[IU] via INTRAVENOUS
  Filled 2015-09-09 (×3): qty 2.5

## 2015-09-09 MED ORDER — PRENATAL MULTIVITAMIN CH
1.0000 | ORAL_TABLET | Freq: Every day | ORAL | Status: DC
  Filled 2015-09-09: qty 1

## 2015-09-09 MED ORDER — ZOLPIDEM TARTRATE 5 MG PO TABS: 5.0000 mg | ORAL_TABLET | Freq: Every evening | ORAL | Status: DC | PRN

## 2015-09-09 MED ORDER — SIMETHICONE 80 MG PO CHEW: 80.0000 mg | CHEWABLE_TABLET | ORAL | Status: DC | PRN

## 2015-09-09 MED ORDER — PENICILLIN G POTASSIUM 5000000 UNITS IJ SOLR
5.0000 10*6.[IU] | Freq: Once | INTRAVENOUS | Status: AC
  Administered 2015-09-09: 5 10*6.[IU] via INTRAVENOUS
  Filled 2015-09-09: qty 5

## 2015-09-09 MED ORDER — WITCH HAZEL-GLYCERIN EX PADS: 1.0000 "application " | MEDICATED_PAD | CUTANEOUS | Status: DC | PRN

## 2015-09-09 MED ORDER — LACTATED RINGERS IV SOLN: 500.0000 mL | Freq: Once | INTRAVENOUS | Status: DC

## 2015-09-09 MED ORDER — OXYTOCIN BOLUS FROM INFUSION
500.0000 mL | INTRAVENOUS | Status: DC
  Administered 2015-09-09: 500 mL via INTRAVENOUS

## 2015-09-09 MED ORDER — FENTANYL 2.5 MCG/ML BUPIVACAINE 1/10 % EPIDURAL INFUSION (WH - ANES)
14.0000 mL/h | INTRAMUSCULAR | Status: DC | PRN
  Administered 2015-09-09: 14 mL/h via EPIDURAL
  Filled 2015-09-09: qty 125

## 2015-09-09 MED ORDER — LACTATED RINGERS IV SOLN
INTRAVENOUS | Status: DC
  Administered 2015-09-09: 15:00:00 via INTRAUTERINE

## 2015-09-09 MED ORDER — LIDOCAINE HCL (PF) 1 % IJ SOLN
30.0000 mL | INTRAMUSCULAR | Status: DC | PRN
  Filled 2015-09-09: qty 30

## 2015-09-09 MED ORDER — EPHEDRINE 5 MG/ML INJ
10.0000 mg | INTRAVENOUS | Status: DC | PRN
  Filled 2015-09-09: qty 2

## 2015-09-09 MED ORDER — LANOLIN HYDROUS EX OINT: TOPICAL_OINTMENT | CUTANEOUS | Status: DC | PRN

## 2015-09-09 MED ORDER — LACTATED RINGERS IV SOLN
2.5000 [IU]/h | INTRAVENOUS | Status: DC
  Filled 2015-09-09: qty 4

## 2015-09-09 MED ORDER — OXYCODONE-ACETAMINOPHEN 5-325 MG PO TABS: 1.0000 | ORAL_TABLET | ORAL | Status: DC | PRN

## 2015-09-09 MED ORDER — ONDANSETRON HCL 4 MG/2ML IJ SOLN: 4.0000 mg | INTRAMUSCULAR | Status: DC | PRN

## 2015-09-09 MED ORDER — ACETAMINOPHEN 325 MG PO TABS: 650.0000 mg | ORAL_TABLET | ORAL | Status: DC | PRN

## 2015-09-09 MED ORDER — IBUPROFEN 600 MG PO TABS
600.0000 mg | ORAL_TABLET | Freq: Four times a day (QID) | ORAL | Status: DC
  Filled 2015-09-09 (×2): qty 1

## 2015-09-09 MED ORDER — CITRIC ACID-SODIUM CITRATE 334-500 MG/5ML PO SOLN: 30.0000 mL | ORAL | Status: DC | PRN

## 2015-09-09 MED ORDER — TETANUS-DIPHTH-ACELL PERTUSSIS 5-2.5-18.5 LF-MCG/0.5 IM SUSP: 0.5000 mL | Freq: Once | INTRAMUSCULAR | Status: DC

## 2015-09-09 MED ORDER — DIPHENHYDRAMINE HCL 25 MG PO CAPS: 25.0000 mg | ORAL_CAPSULE | Freq: Four times a day (QID) | ORAL | Status: DC | PRN

## 2015-09-09 MED ORDER — SENNOSIDES-DOCUSATE SODIUM 8.6-50 MG PO TABS
2.0000 | ORAL_TABLET | ORAL | Status: DC
  Filled 2015-09-09: qty 2

## 2015-09-09 MED ORDER — LACTATED RINGERS IV SOLN
500.0000 mL | INTRAVENOUS | Status: DC | PRN
  Administered 2015-09-09: 500 mL via INTRAVENOUS

## 2015-09-09 MED ORDER — OXYCODONE-ACETAMINOPHEN 5-325 MG PO TABS: 2.0000 | ORAL_TABLET | ORAL | Status: DC | PRN

## 2015-09-09 MED ORDER — LACTATED RINGERS IV SOLN
INTRAVENOUS | Status: DC
Start: 2015-09-09 — End: 2015-09-09
  Administered 2015-09-09: 10:00:00 via INTRAVENOUS

## 2015-09-09 MED ORDER — FENTANYL CITRATE (PF) 100 MCG/2ML IJ SOLN
100.0000 ug | Freq: Once | INTRAMUSCULAR | Status: AC
  Administered 2015-09-09: 100 ug via INTRAVENOUS
  Filled 2015-09-09: qty 2

## 2015-09-09 MED ORDER — PHENYLEPHRINE 40 MCG/ML (10ML) SYRINGE FOR IV PUSH (FOR BLOOD PRESSURE SUPPORT)
80.0000 ug | PREFILLED_SYRINGE | INTRAVENOUS | Status: DC | PRN
  Administered 2015-09-09: 80 ug via INTRAVENOUS
  Filled 2015-09-09: qty 2

## 2015-09-09 MED ORDER — LIDOCAINE HCL (PF) 1 % IJ SOLN
INTRAMUSCULAR | Status: DC | PRN
  Administered 2015-09-09: 8 mL via EPIDURAL
  Administered 2015-09-09: 6 mL via EPIDURAL

## 2015-09-09 MED ORDER — BENZOCAINE-MENTHOL 20-0.5 % EX AERO: 1.0000 "application " | INHALATION_SPRAY | CUTANEOUS | Status: DC | PRN

## 2015-09-09 MED ORDER — DIBUCAINE 1 % RE OINT: 1.0000 "application " | TOPICAL_OINTMENT | RECTAL | Status: DC | PRN

## 2015-09-09 NOTE — Progress Notes (Signed)
Patient ID: Krystal Marshall, female   DOB: 12-05-1985, 30 y.o.   MRN: [id number]  Labor Progress Note Krystal Marshall is a 30 y.o. G2P1001 at 5785w1d who presented for SROM.  S: Pt is feeling occasional painful contractions but otherwise appears to be sitting comfortably.  O:  BP 129/74 mmHg  Pulse 71  Temp(Src) 98.2 F (36.8 C) (Oral)  Resp 18  Ht 5\' 5"  (1.651 m)  Wt 87.998 kg (194 lb)  BMI 32.28 kg/m2  LMP 12/08/2014 (Within Days) EFM: 135 bpm/moderate var/+accels/occasional early decels and one variable decel  CVE: Dilation: 3 Effacement (%): 60 Cervical Position: Middle Station: -2 Presentation: Vertex Exam by:: Herma CarsonLindsay Lima, RN  Ultrasound examination was performed and confirmed cephalic presentation.  A&P: 30 y.o. G2P1001 3385w1d here for SROM. #Labor: Expectant management, anticipate SVD #Pain: Percocet prn #FWB: Category II due to one variable decel #GBS: Penicillin 5 million units was given at 10:50am; will follow with 2.5 million units q4hrs  Felton ClintonKali Ettamae Barkett, Med Student 12:02 PM

## 2015-09-09 NOTE — Anesthesia Procedure Notes (Signed)
Epidural Patient location during procedure: OB Start time: 09/09/2015 4:48 PM End time: 09/09/2015 4:52 PM  Staffing Anesthesiologist: Leilani AbleHATCHETT, Leasia Swann Performed by: anesthesiologist   Preanesthetic Checklist Completed: patient identified, surgical consent, pre-op evaluation, timeout performed, IV checked, risks and benefits discussed and monitors and equipment checked  Epidural Patient position: sitting Prep: site prepped and draped and DuraPrep Patient monitoring: continuous pulse ox and blood pressure Approach: midline Location: L3-L4 Injection technique: LOR air  Needle:  Needle type: Tuohy  Needle gauge: 17 G Needle length: 9 cm and 9 Needle insertion depth: 5 cm cm Catheter type: closed end flexible Catheter size: 19 Gauge Catheter at skin depth: 10 cm Test dose: negative and Other  Assessment Sensory level: T9 Events: blood not aspirated, injection not painful, no injection resistance, negative IV test and no paresthesia  Additional Notes Reason for block:procedure for pain

## 2015-09-09 NOTE — Anesthesia Preprocedure Evaluation (Signed)

## 2015-09-09 NOTE — Progress Notes (Signed)
Krystal Marshall is a 30 y.o. G2P1001 at 6629w1d by LMP and 20w U/S admitted for rupture of membranes  Subjective: Pt is doing well. She is having more frequent contractions.   Objective: BP 128/84 mmHg  Pulse 73  Temp(Src) 98.2 F (36.8 C) (Oral)  Resp 20  Ht 5\' 5"  (1.651 m)  Wt 194 lb (87.998 kg)  BMI 32.28 kg/m2  LMP 12/08/2014 (Within Days)     FHT:  FHR: 140 bpm, variability: moderate,  accelerations:  Present,  decelerations:  Present - persistent variable UC:   regular, every 2 minutes SVE:   Dilation: 5 Effacement (%): 80 Station: -2 Exam by:: dr Alvester Morinnewton, Dr. Nancy MarusMayo  Labs: Lab Results  Component Value Date   WBC 12.2* 09/09/2015   HGB 11.3* 09/09/2015   HCT 33.4* 09/09/2015   MCV 77.3* 09/09/2015   PLT 320 09/09/2015    Assessment / Plan: Spontaneous labor, progressing normally.   -Given that baby is having variable decels which each contraction that have persisted despite positional changes, IUPC was placed and will start amnioinfusion. Scalp electrode has also been placed for monitoring.  Labor: Progressing normally Preeclampsia:  no signs or symptoms of toxicity Fetal Wellbeing:  Category II Pain Control:  Labor support without medications I/D:  GBS positive- PCN Anticipated MOD:  NSVD  Jinny BlossomKaty D Kacen Mellinger 09/09/2015, 3:09 PM

## 2015-09-09 NOTE — H&P (Signed)
LABOR AND DELIVERY ADMISSION HISTORY AND PHYSICAL NOTE  Krystal Marshall is a 30 y.o. female G2P1001 with IUP at [redacted]w[redacted]d by LMP/20w U/S presenting for SROM. At 0400 on 4/6, she experienced leakage of fluids and came to the MAU.  She reports positive fetal movement. She endorses a small amount of vaginal bleeding while wiping in the MAU. She feels like she is having irregular contractions. She feels well overall.  Prenatal History/Complications:  Her pregnancy was complicated by late prenatal care at 19 weeks. Otherwise, had an uneventful pregnancy.  Past Medical History: History reviewed. No pertinent past medical history.  Past Surgical History: Past Surgical History  Procedure Laterality Date  . No past surgeries      Obstetrical History: OB History    Gravida Para Term Preterm AB TAB SAB Ectopic Multiple Living   0 0 0 0 0  1      Social History: Social History   Social History  . Marital Status: Married    Spouse Name: N/A  . Number of Children: N/A  . Years of Education: N/A   Social History Main Topics  . Smoking status: Never Smoker   . Smokeless tobacco: Never Used  . Alcohol Use: No  . Drug Use: No  . Sexual Activity: Yes   Other Topics Concern  . None   Social History Narrative   ** Merged History Encounter **        Family History: Family History  Problem Relation Age of Onset  . Cancer Neg Hx   . Diabetes Neg Hx   . Hypertension Neg Hx     Allergies: No Known Allergies  Prescriptions prior to admission  Medication Sig Dispense Refill Last Dose  . clotrimazole (LOTRIMIN AF) 1 % cream Apply 1 application topically 2 (two) times daily. (Patient not taking: Reported on 09/07/2015) 30 g 0 Completed Course at Unknown time  . terconazole (TERAZOL 7) 0.4 % vaginal cream Place 1 applicator vaginally at bedtime. Use for seven days (Patient not taking: Reported on 08/18/2015) 45 g 0 Completed Course at Unknown time     Review of Systems   All systems  reviewed and negative except as stated in HPI.  Blood pressure 133/70, pulse 77, temperature 98 F (36.7 C), temperature source Oral, resp. rate 18, height  (1.651 m), weight 194 lb (87.998 kg), last menstrual period 12/08/2014, unknown if currently breastfeeding. General appearance: alert, cooperative and no distress Lungs: clear to auscultation bilaterally Heart: regular rate and rhythm Abdomen: soft, non-tender; bowel sounds normal Extremities: No calf swelling or tenderness Presentation: cephalic Fetal monitoring: 140bpm, moderate variability, accelerations present, few intermittent early decels. Uterine activity: Irregular contractions every 1-5 minutes Dilation: 3 Effacement (%): 60 Station: -2 Exam by:: christy wicker,rnc   Prenatal labs: ABO, Rh: AB/POS/-- (11/17 1419) Antibody: NEG (11/17 1419) Rubella: !Error! Immune RPR: NON REAC (01/18 1216)  HBsAg: NEGATIVE (11/17 1419)  HIV: NONREACTIVE (01/18 1216)  GBS:   Positive 1 hr Glucola: Normal Genetic screening: N/a Anatomy US: Normal 20 week [place], EFW 410g, 54th %ile  Prenatal Transfer Tool  Maternal Diabetes: No Genetic Screening: Declined Maternal Ultrasounds/Referrals: Normal Fetal Ultrasounds or other Referrals:  None Maternal Substance Abuse:  No Significant Maternal Medications:  None Significant Maternal Lab Results: Lab values include: Group B Strep positive  Results for orders placed or performed during the hospital encounter of 09/09/15 (from the past 24 hour(s))  Fern Test   Collection Time: 09/09/15  9:26 AM  Result Value Ref Range   POCT Fern Test Positive = ruptured amniotic [place]     Patient Active Problem List   Diagnosis Date Noted  . Language barrier affecting health care 04/22/2015  . Encounter for supervision of other normal pregnancy in second trimester 04/22/2015    Assessment: Krystal Marshall is a 30 y.o. G2P1001 at 7364w1d here for SROM.  #Labor: Currently 3cm dilated, having  irregular contractions. Bishop score is 8. Will re-check in 4 hours and see how she progresses. May consider Pitocin. #Pain: Well-controlled; Percocet prn #FWB: Category 1 #ID: GBS positive- PCN #MOF: Breast #MOC: Depo #Circ: n/a  Jinny BlossomKaty D Mayo 09/09/2015, 9:46 AM   OB fellow attestation:  I have seen and examined this patient; I agree with above documentation in the resident's note.   Krystal ShortHasnaa Marshall is a 30 y.o. G2P1001 here for SROM with contractions.   PE: BP 116/73 mmHg  Pulse 76  Temp(Src) 98.2 F (36.8 C) (Oral)  Resp 18  Ht 5\' 5"  (1.651 m)  Wt 194 lb (87.998 kg)  BMI 32.28 kg/m2  LMP 12/08/2014 (Within Days) Gen: calm comfortable, NAD Resp: normal effort, no distress Abd: gravid  ROS, labs, PMH reviewed  Plan: Admit to LD Augment as needed FWB: cat I GBS pos, start PCN  Family Medicine, OB Fellow 09/09/2015, 4:13 PM

## 2015-09-09 NOTE — MAU Note (Signed)
Pt c/o contractions starting around 0030 last night. Pt states she felt fluid start leaking around 4am. Pt sates the baby is moving normally. Pt denies bleeding.

## 2015-09-10 ENCOUNTER — Encounter (HOSPITAL_COMMUNITY): Payer: Self-pay

## 2015-09-10 LAB — CBC
HCT: 30.5 % — ABNORMAL LOW (ref 36.0–46.0)
Hemoglobin: 10.2 g/dL — ABNORMAL LOW (ref 12.0–15.0)
MCH: 25.6 pg — ABNORMAL LOW (ref 26.0–34.0)
MCHC: 33.4 g/dL (ref 30.0–36.0)
MCV: 76.4 fL — ABNORMAL LOW (ref 78.0–100.0)
Platelets: 271 10*3/uL (ref 150–400)
RBC: 3.99 MIL/uL (ref 3.87–5.11)
RDW: 15.9 % — ABNORMAL HIGH (ref 11.5–15.5)
WBC: 16.1 10*3/uL — ABNORMAL HIGH (ref 4.0–10.5)

## 2015-09-10 LAB — RPR: RPR Ser Ql: NONREACTIVE

## 2015-09-10 LAB — POCT PREGNANCY, URINE: Preg Test, Ur: NEGATIVE

## 2015-09-10 NOTE — Anesthesia Postprocedure Evaluation (Signed)
Anesthesia Post Note  Patient: Krystal Marshall  Procedure(s) Performed: * No procedures listed *  Patient location during evaluation: Mother Baby Anesthesia Type: Epidural Level of consciousness: awake, awake and alert, oriented and patient cooperative Pain management: pain level controlled Vital Signs Assessment: post-procedure vital signs reviewed and stable Respiratory status: spontaneous breathing, nonlabored ventilation and respiratory function stable Cardiovascular status: stable Postop Assessment: no headache, no backache, patient able to bend at knees and no signs of nausea or vomiting Anesthetic complications: no    Last Vitals:  Filed Vitals:   09/10/15 0624 09/10/15 0830  BP: 113/70 115/65  Pulse: 86 76  Temp: 37 C 37.1 C  Resp: 18 18    Last Pain:  Filed Vitals:   09/10/15 0854  PainSc: 0-No pain                 [name]

## 2015-09-10 NOTE — Discharge Instructions (Signed)

## 2015-09-10 NOTE — Lactation Note (Signed)
This note was copied from a baby's chart. Lactation Consultation Note  Patient Name: Krystal Marshall Today's Date: 09/10/2015 Reason for consult: Initial assessment Mom had baby latched when arrived. Baby demonstrating good suckling bursts with swallowing motions noted. Encouraged to continue to BF with feeding ques, Cluster feeding discussed. Advised baby should be at breast 8-12 times in 24 hours and with feeding ques. Mom exp BF for 3-4 months with 1st baby. Reported nipple pain and over time LMS. Mom reports this baby is latching better and denies discomfort. Lactation brochure left for review, advised of OP services and support group. Encouraged to call for assist as needed.   Maternal Data Has patient been taught Hand Expression?: Yes Does the patient have breastfeeding experience prior to this delivery?: Yes  Feeding Feeding Type: Breast Fed  LATCH Score/Interventions Latch: Grasps breast easily, tongue down, lips flanged, rhythmical sucking.  Audible Swallowing: Spontaneous and intermittent  Type of Nipple: Everted at rest and after stimulation  Comfort (Breast/Nipple): Soft / non-tender     Hold (Positioning): No assistance needed to correctly position infant at breast.  LATCH Score: 10  Lactation Tools Discussed/Used WIC Program: Yes   Consult Status Consult Status: Follow-up Date: 09/11/15 Follow-up type: In-patient    Alfred LevinsGranger, Granvil Djordjevic Ann 09/10/2015, 3:03 PM

## 2015-09-10 NOTE — Progress Notes (Signed)
Post Partum Day 1 Subjective: no complaints, up ad lib, voiding, tolerating PO and + flatus  Objective: Blood pressure 115/65, pulse 76, temperature 98.7 F (37.1 C), temperature source Oral, resp. rate 18, height 5\' 5"  (1.651 m), weight 194 lb (87.998 kg), last menstrual period 12/08/2014, SpO2 99 %, unknown if currently breastfeeding.  Physical Exam:  General: alert, cooperative and no distress Lochia: appropriate Uterine Fundus: Firm, 7cm above the umbilicus and to the right Incision: n/a DVT Evaluation: No evidence of DVT seen on physical exam.   Recent Labs  09/09/15 1005 09/10/15 0624  HGB 11.3* 10.2*  HCT 33.4* 30.5*    Assessment/Plan: Plan for discharge tomorrow and Breastfeeding.   LOS: 1 day   Hilton SinclairKaty D Mayo 09/10/2015, 9:09 AM   CNM attestation Post Partum Day #1 I have seen and examined this patient and agree with above documentation in the resident's note.   Aundria RudHasnaa Quade is a 30 y.o. [id number] s/p SVD.  Pt denies problems with ambulating, voiding or po intake. Pain is well controlled.  Plan for birth control is Depo-Provera.  Method of Feeding: breast  PE:  BP 115/65 mmHg  Pulse 76  Temp(Src) 98.7 F (37.1 C) (Oral)  Resp 18  Ht 5\' 5"  (1.651 m)  Wt 87.998 kg (194 lb)  BMI 32.28 kg/m2  SpO2 99%  LMP 12/08/2014 (Within Days)  Breastfeeding? Unknown Fundus firm  Plan for discharge: 09/11/15  Cam HaiSHAW, Camari Wisham, CNM 3:22 PM  09/10/2015

## 2015-09-10 NOTE — Discharge Summary (Signed)
OB Discharge Summary     Patient Name: Krystal Marshall DOB: 1985-06-26 MRN: [id number]  Date of admission: 09/09/2015 Delivering MD: Lyndel SafeNEWTON, KIMBERLY NILES   Date of discharge: 09/10/2015  Admitting diagnosis: 39WKS,PAIN Intrauterine pregnancy: 2132w1d     Secondary diagnosis:  Active Problems:   Active labor at term  Additional problems: SROM     Discharge diagnosis: Term Pregnancy Delivered                                                                                                Post partum procedures:none  Augmentation: none  Complications: None  Hospital course:  Onset of Labor With Vaginal Delivery     30 y.o. yo [id number] at 7532w1d was admitted in Latent Labor on 09/09/2015. Patient had an uncomplicated labor course as follows:  Membrane Rupture Time/Date: 4:00 AM ,09/09/2015   Intrapartum Procedures: Episiotomy: None [1]                                         Lacerations:  Periurethral [8]  Patient had a delivery of a Viable infant. 09/09/2015  Information for the patient's newborn:  Krystal Marshall, Girl Samreen [[id number]][[id number]]  Delivery Method: Vaginal, Spontaneous Delivery (Filed from Delivery Summary)    Pateint had an uncomplicated postpartum course.  She is ambulating, tolerating a regular diet, passing flatus, and urinating well. Patient is discharged home in stable condition on 09/10/2015.    Physical exam  Filed Vitals:   09/10/15 0624 09/10/15 0830 09/10/15 1445 09/10/15 1833  BP: 113/70 115/65 118/72 132/76  Pulse: 86 76 79 76  Temp: 98.6 F (37 C) 98.7 F (37.1 C) 98 F (36.7 C) 98.1 F (36.7 C)  TempSrc: Oral Oral Oral Oral  Resp: 18 18 18 18   Height:      Weight:      SpO2:  99% 100%    General: alert and cooperative Lochia: appropriate Uterine Fundus: firm Incision: N/A DVT Evaluation: No evidence of DVT seen on physical exam. Labs: Lab Results  Component Value Date   WBC 16.1* 09/10/2015   HGB 10.2* 09/10/2015   HCT 30.5* 09/10/2015   MCV 76.4*  09/10/2015   PLT 271 09/10/2015   CMP Latest Ref Rng 09/15/2014  Glucose 70 - 99 mg/dL 87  BUN 6 - 23 mg/dL 6  Creatinine 1.300.50 - 8.651.10 mg/dL 7.840.50  Sodium 696135 - 295145 mmol/L 140  Potassium 3.5 - 5.1 mmol/L 3.5  Chloride 96 - 112 mmol/L 103  CO2 19 - 32 mmol/L -  Calcium 8.4 - 10.5 mg/dL -    Discharge instruction: per After Visit Summary and "Baby and Me Booklet".  After visit meds:    Medication List    STOP taking these medications        clotrimazole 1 % cream  Commonly known as:  LOTRIMIN AF     terconazole 0.4 % vaginal cream  Commonly known as:  TERAZOL 7      TAKE these medications  ibuprofen 600 MG tablet  Commonly known as:  ADVIL,MOTRIN  Take 1 tablet (600 mg total) by mouth every 6 (six) hours as needed.        Diet: routine diet  Activity: Advance as tolerated. Pelvic rest for 6 weeks.   Outpatient follow up:6 weeks Follow up Appt:No future appointments. Follow up Visit:No Follow-up on file.  Postpartum contraception: Depo Provera  Newborn Data: Live born female  Birth Weight: 7 lb 3.2 oz (3265 g) APGAR: 5, 8  Baby Feeding: Breast Disposition:home with mother   09/10/2015 Cam Hai, CNM

## 2015-09-10 NOTE — Progress Notes (Signed)
[facility] Nursery nurse came in to do baby care at 2155.

## 2015-09-11 MED ORDER — IBUPROFEN 600 MG PO TABS: 600.0000 mg | ORAL_TABLET | Freq: Four times a day (QID) | ORAL | Status: DC | PRN

## 2015-09-11 NOTE — Lactation Note (Signed)
This note was copied from a baby's chart. Lactation Consultation Note  Patient Name: Krystal Aundria RudHasnaa Whitesel WUJWJ'XToday's Date: 09/11/2015 Reason for consult: Follow-up assessment  Baby 37 hours old. Mom reports that this baby is latching and nursing much better than her first child. Mom reports that she did ask for a bottle last night because the baby was nursing often and she thought that her milk was not enough. Mom states that her breasts are starting to fill more today. Discussed normal progression of milk coming to volume, and supply and demand. Mom aware of OP/BFSG and LC phone line assistance after D/C.  Maternal Data    Feeding    LATCH Score/Interventions                      Lactation Tools Discussed/Used     Consult Status Consult Status: Complete    Edgardo Petrenko 09/11/2015, 9:02 AM

## 2015-09-11 NOTE — Lactation Note (Signed)
This note was copied from a baby's chart. Lactation Consultation Note  Patient Name: Krystal Marshall ZOXWR'UToday's Date: 09/11/2015 Reason for consult: Follow-up assessment Asked to see mom again to discuss her concerns about her milk supply. Reiterated progression of milk coming to volume and supplementing with formula as needed after having baby at breast with each feeding.   Maternal Data    Feeding Feeding Type: Breast Fed Length of feed: 15 min  LATCH Score/Interventions Latch: Grasps breast easily, tongue down, lips flanged, rhythmical sucking.  Audible Swallowing: Spontaneous and intermittent  Type of Nipple: Everted at rest and after stimulation  Comfort (Breast/Nipple): Soft / non-tender     Hold (Positioning): No assistance needed to correctly position infant at breast.  LATCH Score: 10  Lactation Tools Discussed/Used     Consult Status Consult Status: Complete    Geralynn OchsWILLIARD, Kazimierz Springborn 09/11/2015, 10:22 AM

## 2015-09-15 ENCOUNTER — Other Ambulatory Visit: Payer: Medicaid Other

## 2015-10-26 ENCOUNTER — Ambulatory Visit: Payer: Medicaid Other | Admitting: Obstetrics and Gynecology

## 2015-11-10 ENCOUNTER — Encounter: Payer: Medicaid Other | Admitting: Advanced Practice Midwife

## 2015-12-29 ENCOUNTER — Ambulatory Visit (INDEPENDENT_AMBULATORY_CARE_PROVIDER_SITE_OTHER): Payer: Medicaid Other | Admitting: General Practice

## 2015-12-29 DIAGNOSIS — Z3202 Encounter for pregnancy test, result negative: Secondary | ICD-10-CM | POA: Diagnosis present

## 2015-12-29 LAB — POCT PREGNANCY, URINE: Preg Test, Ur: NEGATIVE

## 2015-12-29 NOTE — Progress Notes (Signed)
Patient here for upt today. upt negative. Patient states she is only breastfeeding. Discussed with patient that she likely will not have periods when she is exclusively breastfeeding. Patient verbalized understanding and wishes to start birth control so she doesn't get pregnant. Patient reports using pull out method. Discussed with patient making an appt to come in for birth control and provided condoms to her to use in the interim. Patient verbalized understanding. Patient asked about her toe nails turning green. Had Dr Genevie Ann talk to patient, who recommended vicks vapor rub at night to be applied to nail. Patient verbalized understanding to all & had no questions. Patient will return for contraception visit.

## 2016-10-19 ENCOUNTER — Encounter (HOSPITAL_COMMUNITY): Payer: Self-pay | Admitting: *Deleted

## 2016-10-19 ENCOUNTER — Ambulatory Visit (HOSPITAL_COMMUNITY)
Admission: EM | Admit: 2016-10-19 | Discharge: 2016-10-19 | Disposition: A | Discharge status: Home / Self Care | Payer: Medicaid Other | Attending: Internal Medicine | Admitting: Internal Medicine

## 2016-10-19 DIAGNOSIS — R109 Unspecified abdominal pain: Secondary | ICD-10-CM

## 2016-10-19 DIAGNOSIS — Z3202 Encounter for pregnancy test, result negative: Secondary | ICD-10-CM

## 2016-10-19 DIAGNOSIS — S161XXA Strain of muscle, fascia and tendon at neck level, initial encounter: Secondary | ICD-10-CM

## 2016-10-19 LAB — POCT URINALYSIS DIP (DEVICE)
Bilirubin Urine: NEGATIVE
Glucose, UA: NEGATIVE mg/dL
Hgb urine dipstick: NEGATIVE
Ketones, ur: NEGATIVE mg/dL
Nitrite: NEGATIVE
Protein, ur: NEGATIVE mg/dL
Specific Gravity, Urine: 1.02 (ref 1.005–1.030)
Urobilinogen, UA: 0.2 mg/dL (ref 0.0–1.0)
pH: 6.5 (ref 5.0–8.0)

## 2016-10-19 LAB — POCT PREGNANCY, URINE: Preg Test, Ur: NEGATIVE

## 2016-10-19 MED ORDER — KETOROLAC TROMETHAMINE 60 MG/2ML IM SOLN
INTRAMUSCULAR | Status: AC
  Filled 2016-10-19: qty 2

## 2016-10-19 MED ORDER — NAPROXEN 500 MG PO TABS: 500.0000 mg | ORAL_TABLET | Freq: Two times a day (BID) | ORAL | 0 refills | Status: DC

## 2016-10-19 MED ORDER — CYCLOBENZAPRINE HCL 10 MG PO TABS: 10.0000 mg | ORAL_TABLET | Freq: Two times a day (BID) | ORAL | 0 refills | Status: DC | PRN

## 2016-10-19 MED ORDER — IBUPROFEN 600 MG PO TABS: 600.0000 mg | ORAL_TABLET | Freq: Four times a day (QID) | ORAL | 0 refills | Status: DC | PRN

## 2016-10-19 MED ORDER — KETOROLAC TROMETHAMINE 60 MG/2ML IM SOLN
60.0000 mg | Freq: Once | INTRAMUSCULAR | Status: AC
  Administered 2016-10-19: 60 mg via INTRAMUSCULAR

## 2016-10-19 NOTE — ED Provider Notes (Signed)
CSN: [id number]     Arrival date & time 10/19/16  1125 History   None    Chief Complaint  Patient presents with  . Flank Pain   (Consider location/radiation/quality/duration/timing/severity/associated sxs/prior Treatment) Patient slept wrong on her right neck and back and is having discomfort.   The history is provided by the patient.  Flank Pain  This is a new problem. The current episode started 6 to 12 hours ago. The problem occurs constantly. The problem has not changed since onset.Nothing aggravates the symptoms. Nothing relieves the symptoms. She has tried nothing for the symptoms.    History reviewed. No pertinent past medical history. Past Surgical History:  Procedure Laterality Date  . NO PAST SURGERIES     Family History  Problem Relation Age of Onset  . Cancer Neg Hx   . Diabetes Neg Hx   . Hypertension Neg Hx    Social History  Substance Use Topics  . Smoking status: Never Smoker  . Smokeless tobacco: Never Used  . Alcohol use No   OB History    Gravida Para Term Preterm AB Living   2 2 2  0 0 2   SAB TAB Ectopic Multiple Live Births   0 0 0 0 2     Review of Systems  Constitutional: Negative.   HENT: Negative.   Eyes: Negative.   Respiratory: Negative.   Cardiovascular: Negative.   Gastrointestinal: Negative.   Endocrine: Negative.   Genitourinary: Positive for flank pain.  Musculoskeletal: Positive for arthralgias.  Allergic/Immunologic: Negative.   Neurological: Negative.   Hematological: Negative.   Psychiatric/Behavioral: Negative.     Allergies  Patient has no known allergies.  Home Medications   Prior to Admission medications   Medication Sig Start Date End Date Taking? Authorizing Provider  cyclobenzaprine (FLEXERIL) 10 MG tablet Take 1 tablet (10 mg total) by mouth 2 (two) times daily as needed for muscle spasms. 10/19/16   Deatra Canterxford, Antwione Picotte J, FNP  ibuprofen (ADVIL,MOTRIN) 600 MG tablet Take 1 tablet (600 mg total) by mouth every 6  (six) hours as needed. 09/11/15   Arabella MerlesShaw, Kimberly D, CNM  ibuprofen (ADVIL,MOTRIN) 600 MG tablet Take 1 tablet (600 mg total) by mouth every 6 (six) hours as needed. 10/19/16   Deatra Canterxford, Eri Platten J, FNP   Meds Ordered and Administered this Visit   Medications  ketorolac (TORADOL) injection 60 mg (60 mg Intramuscular Given 10/19/16 1237)    BP 128/67 (BP Location: Right Arm)   Pulse 85   Temp 99 F (37.2 C) (Oral)   Resp 16   LMP 09/19/2016 (Within Days)   SpO2 98%  No data found.   Physical Exam  Constitutional: She appears well-developed and well-nourished.  HENT:  Head: Normocephalic and atraumatic.  Eyes: Conjunctivae and EOM are normal. Pupils are equal, round, and reactive to light.  Neck: Normal range of motion. Neck supple.  Cardiovascular: Normal rate, regular rhythm and normal heart sounds.   Pulmonary/Chest: Effort normal and breath sounds normal.  Musculoskeletal: She exhibits tenderness.  TTP right cervical spine, trapezius, and myofascial area.  Nursing note and vitals reviewed.   Urgent Care Course     Procedures (including critical care time)  Labs Review Labs Reviewed  POCT URINALYSIS DIP (DEVICE) - Abnormal; Notable for the following:       Result Value   Leukocytes, UA TRACE (*)    All other components within normal limits  POCT PREGNANCY, URINE    Imaging Review No results found.  Visual Acuity Review  Right Eye Distance:   Left Eye Distance:   Bilateral Distance:    Right Eye Near:   Left Eye Near:    Bilateral Near:         MDM   1. Strain of cervical portion of right trapezius muscle    Motrin 600mg  one po tid x 7 days Cyclobenzaprine Toradol 60mg  IM      Deatra Canter, FNP 10/19/16 1244

## 2016-10-19 NOTE — Medical Student Note (Signed)
[facility] Provider Student Note For educational purposes for Medical, PA and NP students only and not part of the legal medical record.   CSN: [id number] Arrival date & time: 10/19/16  1125     History   Chief Complaint Chief Complaint  Patient presents with  . Flank Pain    HPI Krystal Marshall is a 31 y.o. female.  Pt here for severe lower back pain and upper back pain more on the right side with radiation in right neck, shoulder and arm. Pt sts that she slept on her right side and woke up this way. sts hurts with movement, bending, turning and unable to turn neck completely.    The history is provided by the patient. The history is limited by a language barrier.  Flank Pain  This is a new problem. The current episode started 3 to 5 hours ago. The problem occurs constantly. The problem has not changed since onset.Pertinent negatives include no chest pain, no abdominal pain, no headaches and no shortness of breath. The symptoms are aggravated by bending, twisting, walking and standing. Nothing relieves the symptoms. She has tried nothing for the symptoms.    History reviewed. No pertinent past medical history.  Patient Active Problem List   Diagnosis Date Noted  . Active labor at term 09/09/2015  . Language barrier affecting health care 04/22/2015  . Encounter for supervision of other normal pregnancy in second trimester 04/22/2015    Past Surgical History:  Procedure Laterality Date  . NO PAST SURGERIES      OB History    Gravida Para Term Preterm AB Living   2 2 2  0 0 2   SAB TAB Ectopic Multiple Live Births   0 0 0 0 2       Home Medications    Prior to Admission medications   Medication Sig Start Date End Date Taking? Authorizing Provider  cyclobenzaprine (FLEXERIL) 10 MG tablet Take 1 tablet (10 mg total) by mouth 2 (two) times daily as needed for muscle spasms. 10/19/16   Deatra Canterxford, William J, FNP  ibuprofen (ADVIL,MOTRIN) 600 MG tablet Take 1  tablet (600 mg total) by mouth every 6 (six) hours as needed. 09/11/15   Arabella MerlesShaw, Kimberly D, CNM  ibuprofen (ADVIL,MOTRIN) 600 MG tablet Take 1 tablet (600 mg total) by mouth every 6 (six) hours as needed. 10/19/16   Deatra Canterxford, William J, FNP    Family History Family History  Problem Relation Age of Onset  . Cancer Neg Hx   . Diabetes Neg Hx   . Hypertension Neg Hx     Social History Social History  Substance Use Topics  . Smoking status: Never Smoker  . Smokeless tobacco: Never Used  . Alcohol use No     Allergies   Patient has no known allergies.   Review of Systems Review of Systems  Constitutional: Negative for fever.  HENT: Negative.   Eyes: Negative.   Respiratory: Negative for cough and shortness of breath.   Cardiovascular: Negative for chest pain.  Gastrointestinal: Negative for abdominal pain, diarrhea, nausea and vomiting.  Endocrine: Negative.   Genitourinary: Negative for dysuria, flank pain, frequency and hematuria.  Musculoskeletal: Positive for myalgias.  Skin: Negative.   Allergic/Immunologic: Negative.   Neurological: Negative for weakness, numbness and headaches.  Hematological: Negative.   Psychiatric/Behavioral: Negative.      Physical Exam Updated Vital Signs BP 128/67 (BP Location: Right Arm)   Pulse 85   Temp 99 F (37.2 C) (  Oral)   Resp 16   LMP 09/19/2016 (Within Days)   SpO2 98%   Physical Exam  Constitutional: She is oriented to person, place, and time. She appears well-developed and well-nourished. She appears distressed.  HENT:  Head: Normocephalic and atraumatic.  Eyes: EOM are normal. Pupils are equal, round, and reactive to light.  Neck:  Pt having right lateral neck pain with rotation.   Cardiovascular: Normal rate, regular rhythm and normal heart sounds.   Pulmonary/Chest: Effort normal and breath sounds normal.  Abdominal: Soft. Bowel sounds are normal.  Musculoskeletal: She exhibits tenderness.  Pt TTP paravertebral   muscles to thoracic spine. Hurts with flexion, extension and rotation. Pt also TTP to trapezius muscle.   Lymphadenopathy:    She has no cervical adenopathy.  Neurological: She is alert and oriented to person, place, and time. No sensory deficit.  Skin: Skin is warm and dry. Capillary refill takes less than 2 seconds.  Psychiatric: She has a normal mood and affect.     ED Treatments / Results  Labs (all labs ordered are listed, but only abnormal results are displayed) Labs Reviewed  POCT URINALYSIS DIP (DEVICE) - Abnormal; Notable for the following:       Result Value   Leukocytes, UA TRACE (*)    All other components within normal limits  POCT PREGNANCY, URINE    EKG  EKG Interpretation None       Radiology No results found.  Procedures Procedures (including critical care time)  Medications Ordered in ED Medications  ketorolac (TORADOL) injection 60 mg (60 mg Intramuscular Given 10/19/16 1237)     Initial Impression / Assessment and Plan / ED Course  I have reviewed the triage vital signs and the nursing notes.  Pertinent labs & imaging results that were available during my care of the patient were reviewed by me and considered in my medical decision making (see chart for details).       Final Clinical Impressions(s) / ED Diagnoses   Final diagnoses:  Strain of cervical portion of right trapezius muscle    New Prescriptions New Prescriptions   CYCLOBENZAPRINE (FLEXERIL) 10 MG TABLET    Take 1 tablet (10 mg total) by mouth 2 (two) times daily as needed for muscle spasms.   IBUPROFEN (ADVIL,MOTRIN) 600 MG TABLET    Take 1 tablet (600 mg total) by mouth every 6 (six) hours as needed.

## 2016-10-19 NOTE — ED Triage Notes (Signed)
Pt  Reports  r  Flank  Pain  That  Started  Last   Pm    Denies  Any other  Injury      Denies   Vomiting     Frequent  Urination

## 2017-02-06 ENCOUNTER — Encounter: Payer: Self-pay | Admitting: General Practice

## 2017-02-06 ENCOUNTER — Ambulatory Visit (INDEPENDENT_AMBULATORY_CARE_PROVIDER_SITE_OTHER): Payer: Self-pay

## 2017-02-06 DIAGNOSIS — Z3201 Encounter for pregnancy test, result positive: Secondary | ICD-10-CM

## 2017-02-06 LAB — POCT PREGNANCY, URINE: Preg Test, Ur: POSITIVE — AB

## 2017-02-06 NOTE — Progress Notes (Signed)
Chart reviewed for nurse visit. Agree with plan of care.   Marny LowensteinWenzel, Julie N, PA-C 02/06/2017 1:19 PM

## 2017-02-06 NOTE — Progress Notes (Signed)
[employer] interpreter Marwa [phone]. Patient presented to the office for a UPT. UPT positive. Patient states that her LMP was two months ago but is not sure of the date. Patient states that she is not taking any medications. Advised patient to start prenatal vitamins and to start prenatal care. Patient verbalized understanding and had no questions.

## 2017-03-19 ENCOUNTER — Ambulatory Visit (INDEPENDENT_AMBULATORY_CARE_PROVIDER_SITE_OTHER): Payer: Medicaid Other | Admitting: Advanced Practice Midwife

## 2017-03-19 ENCOUNTER — Other Ambulatory Visit (HOSPITAL_COMMUNITY)
Admission: RE | Admit: 2017-03-19 | Discharge: 2017-03-19 | Disposition: A | Discharge status: Home / Self Care | Payer: Medicaid Other | Source: Ambulatory Visit | Attending: Advanced Practice Midwife | Admitting: Advanced Practice Midwife

## 2017-03-19 ENCOUNTER — Ambulatory Visit: Payer: Self-pay

## 2017-03-19 ENCOUNTER — Encounter: Payer: Self-pay | Admitting: Advanced Practice Midwife

## 2017-03-19 VITALS — BP 115/51 | HR 69

## 2017-03-19 DIAGNOSIS — Z3480 Encounter for supervision of other normal pregnancy, unspecified trimester: Secondary | ICD-10-CM | POA: Diagnosis not present

## 2017-03-19 DIAGNOSIS — O3680X0 Pregnancy with inconclusive fetal viability, not applicable or unspecified: Secondary | ICD-10-CM | POA: Diagnosis present

## 2017-03-19 DIAGNOSIS — O09299 Supervision of pregnancy with other poor reproductive or obstetric history, unspecified trimester: Secondary | ICD-10-CM

## 2017-03-19 DIAGNOSIS — Z113 Encounter for screening for infections with a predominantly sexual mode of transmission: Secondary | ICD-10-CM | POA: Diagnosis not present

## 2017-03-19 DIAGNOSIS — Z23 Encounter for immunization: Secondary | ICD-10-CM

## 2017-03-19 DIAGNOSIS — Z1379 Encounter for other screening for genetic and chromosomal anomalies: Secondary | ICD-10-CM

## 2017-03-19 HISTORY — DX: Supervision of pregnancy with other poor reproductive or obstetric history, unspecified trimester: O09.299

## 2017-03-19 HISTORY — DX: Encounter for supervision of other normal pregnancy, unspecified trimester: Z34.80

## 2017-03-19 MED ORDER — PRENATAL VITAMINS 0.8 MG PO TABS: 1.0000 | ORAL_TABLET | Freq: Every day | ORAL | 12 refills | Status: DC

## 2017-03-19 MED ORDER — PROMETHAZINE HCL 25 MG PO TABS: 12.5000 mg | ORAL_TABLET | Freq: Four times a day (QID) | ORAL | 0 refills | Status: DC | PRN

## 2017-03-19 NOTE — Progress Notes (Signed)
Arabic interpreter Zina [phone]

## 2017-03-19 NOTE — Addendum Note (Signed)
Addended by: Garret Reddish on: 03/19/2017 10:39 AM   Modules accepted: Orders

## 2017-03-19 NOTE — Progress Notes (Signed)
  Subjective:    Krystal Marshall is being seen today for her first obstetrical visit.  This is not a planned pregnancy. She is at [redacted]w[redacted]d gestation. Her obstetrical history is significant for shoulder dystocia with first baby. Relationship with FOB: spouse, living together. Patient does intend to breast feed. Pregnancy history fully reviewed.  Patient reports no complaints.   Arabic intp: #[id number] Review of Systems:   Review of Systems  Constitutional: Negative for chills and fever.  Gastrointestinal: Negative for nausea and vomiting.  Genitourinary: Negative for dysuria, frequency, pelvic pain, vaginal bleeding and vaginal discharge.    Objective:     BP (!) 115/51   Pulse 69   LMP  (LMP Unknown)  Physical Exam  Nursing note and vitals reviewed. Constitutional: She is oriented to person, place, and time. She appears well-developed and well-nourished. No distress.  HENT:  Head: Normocephalic.  Cardiovascular: Normal rate.   Respiratory: Effort normal.  GI: Soft. There is no tenderness. There is no rebound.  Neurological: She is alert and oriented to person, place, and time.  Skin: Skin is warm and dry.  Psychiatric: She has a normal mood and affect.      Fetal Exam Fetal Monitor Review: Ultrasound done today with +cardiac activity, [facility] 09/28/16         Assessment:    Pregnancy: [id number] Patient Active Problem List   Diagnosis Date Noted  . Supervision of other normal pregnancy, antepartum 03/19/2017  . Language barrier affecting health care 04/22/2015       Plan:     Initial labs drawn. Flu vaccine today  Prenatal vitamins. Problem list reviewed and updated. AFP3 discussed: requested. Role of ultrasound in pregnancy discussed; fetal survey: requested. Amniocentesis discussed: not indicated. Follow up in 4 weeks. 50% of 30 min visit spent on counseling and coordination of care.     Thressa Sheller 03/19/2017

## 2017-03-19 NOTE — Addendum Note (Signed)
Addended by: Thressa Sheller D on: 03/19/2017 11:29 AM   Modules accepted: Orders

## 2017-03-19 NOTE — Progress Notes (Signed)
Pt informed that the ultrasound is considered a limited OB ultrasound and is not intended to be a complete ultrasound exam.  Patient also informed that the ultrasound is not being completed with the intent of assessing for fetal or placental anomalies or any pelvic abnormalities.  Explained that the purpose of today's ultrasound is to assess for viability.  Patient acknowledges the purpose of the exam and the limitations of the study.  Single IUP FHR - 154 per M-mode FM present CRL - 6.12 cm (12w 3d)   EDD 09/28/17

## 2017-03-19 NOTE — Patient Instructions (Addendum)
Second Trimester of Pregnancy The second trimester is from week 13 through week 28, month 4 through 6. This is often the time in pregnancy that you feel your best. Often times, morning sickness has lessened or quit. You may have more energy, and you may get hungry more often. Your unborn baby (fetus) is growing rapidly. At the end of the sixth month, he or she is about 9 inches long and weighs about 1 pounds. You will likely feel the baby move (quickening) between 18 and 20 weeks of pregnancy. Follow these instructions at home:  Avoid all smoking, herbs, and alcohol. Avoid drugs not approved by your doctor.  Do not use any tobacco products, including cigarettes, chewing tobacco, and electronic cigarettes. If you need help quitting, ask your doctor. You may get counseling or other support to help you quit.  Only take medicine as told by your doctor. Some medicines are safe and some are not during pregnancy.  Exercise only as told by your doctor. Stop exercising if you start having cramps.  Eat regular, healthy meals.  Wear a good support bra if your breasts are tender.  Do not use hot tubs, steam rooms, or saunas.  Wear your seat belt when driving.  Avoid raw meat, uncooked cheese, and liter boxes and soil used by cats.  Take your prenatal vitamins.  Take 1500-2000 milligrams of calcium daily starting at the 20th week of pregnancy until you deliver your baby.  Try taking medicine that helps you poop (stool softener) as needed, and if your doctor approves. Eat more fiber by eating fresh fruit, vegetables, and whole grains. Drink enough fluids to keep your pee (urine) clear or pale yellow.  Take warm water baths (sitz baths) to soothe pain or discomfort caused by hemorrhoids. Use hemorrhoid cream if your doctor approves.  If you have puffy, bulging veins (varicose veins), wear support hose. Raise (elevate) your feet for 15 minutes, 3-4 times a day. Limit salt in your diet.  Avoid heavy  lifting, wear low heals, and sit up straight.  Rest with your legs raised if you have leg cramps or low back pain.  Visit your dentist if you have not gone during your pregnancy. Use a soft toothbrush to brush your teeth. Be gentle when you floss.  You can have sex (intercourse) unless your doctor tells you not to.  Go to your doctor visits. Get help if:  You feel dizzy.  You have mild cramps or pressure in your lower belly (abdomen).  You have a nagging pain in your belly area.  You continue to feel sick to your stomach (nauseous), throw up (vomit), or have watery poop (diarrhea).  You have bad smelling fluid coming from your vagina.  You have pain with peeing (urination). Get help right away if:  You have a fever.  You are leaking fluid from your vagina.  You have spotting or bleeding from your vagina.  You have severe belly cramping or pain.  You lose or gain weight rapidly.  You have trouble catching your breath and have chest pain.  You notice sudden or extreme puffiness (swelling) of your face, hands, ankles, feet, or legs.  You have not felt the baby move in over an hour.  You have severe headaches that do not go away with medicine.  You have vision changes. This information is not intended to replace advice given to you by your health care provider. Make sure you discuss any questions you have with your health care   provider. Document Released: 08/16/2009 Document Revised: 10/28/2015 Document Reviewed: 07/23/2012 Elsevier Interactive Patient Education  2017 [employer]vier Inc.  Safe Medications in Pregnancy   Acne: Benzoyl Peroxide Salicylic Acid  Backache/Headache: Tylenol: 2 regular strength every 4 hours OR              2 Extra strength every 6 hours  Colds/Coughs/Allergies: Benadryl (alcohol free) 25 mg every 6 hours as needed Breath right strips Claritin Cepacol throat lozenges Chloraseptic throat spray Cold-Eeze- up to three times per day Cough  drops, alcohol free Flonase (by prescription only) Guaifenesin Mucinex Robitussin DM (plain only, alcohol free) Saline nasal spray/drops Sudafed (pseudoephedrine) & Actifed ** use only after [redacted] weeks gestation and if you do not have high blood pressure Tylenol Vicks Vaporub Zinc lozenges Zyrtec   Constipation: Colace Ducolax suppositories Fleet enema Glycerin suppositories Metamucil Milk of magnesia Miralax Senokot Smooth move tea  Diarrhea: Kaopectate Imodium A-D  *NO pepto Bismol  Hemorrhoids: Anusol Anusol HC Preparation H Tucks  Indigestion: Tums Maalox Mylanta Zantac  Pepcid  Insomnia: Benadryl (alcohol free) 25mg every 6 hours as needed Tylenol PM Unisom, no Gelcaps  Leg Cramps: Tums MagGel  Nausea/Vomiting:  Bonine Dramamine Emetrol Ginger extract Sea bands Meclizine  Nausea medication to take during pregnancy:  Unisom (doxylamine succinate 25 mg tablets) Take one tablet daily at bedtime. If symptoms are not adequately controlled, the dose can be increased to a maximum recommended dose of two tablets daily (1/2 tablet in the morning, 1/2 tablet mid-afternoon and one at bedtime). Vitamin B6 100mg tablets. Take one tablet twice a day (up to 200 mg per day).  Skin Rashes: Aveeno products Benadryl cream or 25mg every 6 hours as needed Calamine Lotion 1% cortisone cream  Yeast infection: Gyne-lotrimin 7 Monistat 7   **If taking multiple medications, please check labels to avoid duplicating the same active ingredients **take medication as directed on the label ** Do not exceed 4000 mg of tylenol in 24 hours **Do not take medications that contain aspirin or ibuprofen     

## 2017-03-20 LAB — OBSTETRIC PANEL, INCLUDING HIV
Antibody Screen: NEGATIVE
Basophils Absolute: 0 10*3/uL (ref 0.0–0.2)
Basos: 0 %
EOS (ABSOLUTE): 0.1 10*3/uL (ref 0.0–0.4)
Eos: 1 %
HIV Screen 4th Generation wRfx: NONREACTIVE
Hematocrit: 34.6 % (ref 34.0–46.6)
Hemoglobin: 11.3 g/dL (ref 11.1–15.9)
Hepatitis B Surface Ag: NEGATIVE
Immature Grans (Abs): 0 10*3/uL (ref 0.0–0.1)
Immature Granulocytes: 0 %
Lymphocytes Absolute: 2.3 10*3/uL (ref 0.7–3.1)
Lymphs: 25 %
MCH: 27.1 pg (ref 26.6–33.0)
MCHC: 32.7 g/dL (ref 31.5–35.7)
MCV: 83 fL (ref 79–97)
Monocytes Absolute: 0.5 10*3/uL (ref 0.1–0.9)
Monocytes: 5 %
Neutrophils Absolute: 6.5 10*3/uL (ref 1.4–7.0)
Neutrophils: 69 %
Platelets: 299 10*3/uL (ref 150–379)
RBC: 4.17 x10E6/uL (ref 3.77–5.28)
RDW: 14.8 % (ref 12.3–15.4)
RPR Ser Ql: NONREACTIVE
Rh Factor: POSITIVE
Rubella Antibodies, IGG: 28.8 index (ref >0.99)
WBC: 9.4 10*3/uL (ref 3.4–10.8)

## 2017-03-20 LAB — CERVICOVAGINAL ANCILLARY ONLY
Chlamydia: NEGATIVE
Neisseria Gonorrhea: NEGATIVE

## 2017-03-22 ENCOUNTER — Other Ambulatory Visit (HOSPITAL_COMMUNITY): Payer: Medicaid Other

## 2017-03-22 ENCOUNTER — Inpatient Hospital Stay (HOSPITAL_COMMUNITY): Admission: RE | Admit: 2017-03-22 | Payer: Medicaid Other | Source: Ambulatory Visit

## 2017-03-23 LAB — URINE CULTURE, OB REFLEX

## 2017-03-23 LAB — CULTURE, OB URINE

## 2017-04-16 ENCOUNTER — Encounter: Payer: Medicaid Other | Admitting: Advanced Practice Midwife

## 2017-04-17 ENCOUNTER — Ambulatory Visit (INDEPENDENT_AMBULATORY_CARE_PROVIDER_SITE_OTHER): Payer: Medicaid Other | Admitting: Medical

## 2017-04-17 ENCOUNTER — Encounter: Payer: Self-pay | Admitting: Medical

## 2017-04-17 VITALS — BP 104/63 | HR 71 | Wt 178.3 lb

## 2017-04-17 DIAGNOSIS — Z789 Other specified health status: Secondary | ICD-10-CM

## 2017-04-17 DIAGNOSIS — Z3480 Encounter for supervision of other normal pregnancy, unspecified trimester: Secondary | ICD-10-CM

## 2017-04-17 NOTE — Progress Notes (Signed)
[employer] Interpreter Zayneb [phone]

## 2017-04-17 NOTE — Progress Notes (Signed)
   PRENATAL VISIT NOTE  Subjective:  Krystal Marshall is a 31 y.o. G3P2002 at 5348w4d being seen today for ongoing prenatal care.  She is currently monitored for the following issues for this low-risk pregnancy and has Language barrier affecting health care; Supervision of other normal pregnancy, antepartum; and History of shoulder dystocia in prior pregnancy, currently pregnant on their problem list.  Patient reports no complaints.  Contractions: Not present. Vag. Bleeding: None.  Movement: Absent. Denies leaking of fluid.   The following portions of the patient's history were reviewed and updated as appropriate: allergies, current medications, past family history, past medical history, past social history, past surgical history and problem list. Problem list updated.  Objective:   Vitals:   04/17/17 1005  BP: 104/63  Pulse: 71  Weight: 178 lb 4.8 oz (80.9 kg)    Fetal Status: Fetal Heart Rate (bpm): 155   Movement: Absent     General:  Alert, oriented and cooperative. Patient is in no acute distress.  Skin: Skin is warm and dry. No rash noted.   Cardiovascular: Normal heart rate noted  Respiratory: Normal respiratory effort, no problems with respiration noted  Abdomen: Soft, gravid, appropriate for gestational age.  Pain/Pressure: Absent     Pelvic: Cervical exam deferred        Extremities: Normal range of motion.  Edema: None  Mental Status:  Normal mood and affect. Normal behavior. Normal judgment and thought content.   Assessment and Plan:  Pregnancy: G3P2002 at 5848w4d  1. Supervision of other normal pregnancy, antepartum - AFP TETRA - US MFM OB COMP + 14 WK; Future  2. Language barrier affecting health care - [employer] interpreter used  Preterm labor symptoms and general obstetric precautions including but not limited to vaginal bleeding, contractions, leaking of fluid and fetal movement were reviewed in detail with the patient. Please refer to After Visit Summary for other  counseling recommendations.  Return in about 4 weeks (around 05/15/2017) for LOB.   Vonzella NippleJulie Wenzel, PA-C

## 2017-04-17 NOTE — Patient Instructions (Signed)

## 2017-04-20 LAB — AFP TETRA
DIA Mom Value: 0.88
DIA Value (EIA): 135.76 pg/mL
DSR (By Age)    1 IN: 405
DSR (Second Trimester) 1 IN: 3979
Gestational Age: 16 WEEKS
MSAFP Mom: 1.47
MSAFP: 43.2 ng/mL
MSHCG Mom: 1.01
MSHCG: 35452 m[IU]/mL
Maternal Age At EDD: 33.5 yr
Osb Risk: 2965
T18 (By Age): 1:1576 {titer}
Test Results:: NEGATIVE
Weight: 178 [lb_av]
uE3 Mom: 0.9
uE3 Value: 0.66 ng/mL

## 2017-05-07 ENCOUNTER — Ambulatory Visit (HOSPITAL_COMMUNITY)
Admission: RE | Admit: 2017-05-07 | Discharge: 2017-05-07 | Disposition: A | Discharge status: Home / Self Care | Payer: Medicaid Other | Source: Ambulatory Visit | Attending: Medical | Admitting: Medical

## 2017-05-07 ENCOUNTER — Other Ambulatory Visit: Payer: Self-pay | Admitting: Medical

## 2017-05-07 DIAGNOSIS — Z3480 Encounter for supervision of other normal pregnancy, unspecified trimester: Secondary | ICD-10-CM

## 2017-05-07 DIAGNOSIS — Z3689 Encounter for other specified antenatal screening: Secondary | ICD-10-CM | POA: Insufficient documentation

## 2017-05-07 DIAGNOSIS — Z3A19 19 weeks gestation of pregnancy: Secondary | ICD-10-CM

## 2017-05-08 ENCOUNTER — Encounter: Payer: Self-pay | Admitting: Medical

## 2017-05-08 DIAGNOSIS — O4420 Partial placenta previa NOS or without hemorrhage, unspecified trimester: Secondary | ICD-10-CM | POA: Insufficient documentation

## 2017-05-09 ENCOUNTER — Telehealth: Payer: Self-pay | Admitting: General Practice

## 2017-05-09 NOTE — Telephone Encounter (Signed)
-----   Message from Marny LowensteinJulie N Wenzel, PA-C sent at 05/08/2017  1:48 PM EST ----- Please inform patient baby looked normal on US, but that edge of the placenta was close to the cervix. They want to repeat in mid January and that will be scheduled at her next visit on 05/14/17. In the meantime she should be on pelvic rest and avoid intercourse or anything in the vagina until her repeat US in January.   Raynelle FanningJulie

## 2017-05-09 NOTE — Telephone Encounter (Signed)
Called patient with pacific interpreter [phone] and discussed results & recommendations. Patient verbalized understanding to all & had no questions

## 2017-05-14 ENCOUNTER — Encounter: Payer: Medicaid Other | Admitting: Advanced Practice Midwife

## 2017-05-15 ENCOUNTER — Telehealth: Payer: Self-pay | Admitting: General Practice

## 2017-05-15 NOTE — Telephone Encounter (Signed)
Called patient with Interpreter to reschedule appointment missed on 05/14/17 d/t weather.  Left message on VM in regards to appointment on 05/22/17 at 2:20pm.

## 2017-05-22 ENCOUNTER — Encounter: Payer: Self-pay | Admitting: General Practice

## 2017-05-22 ENCOUNTER — Encounter: Payer: Medicaid Other | Admitting: Advanced Practice Midwife

## 2017-06-12 ENCOUNTER — Ambulatory Visit (INDEPENDENT_AMBULATORY_CARE_PROVIDER_SITE_OTHER): Payer: Medicaid Other | Admitting: Obstetrics and Gynecology

## 2017-06-12 VITALS — BP 107/50 | HR 71 | Wt 179.9 lb

## 2017-06-12 DIAGNOSIS — O4420 Partial placenta previa NOS or without hemorrhage, unspecified trimester: Secondary | ICD-10-CM

## 2017-06-12 DIAGNOSIS — Z3480 Encounter for supervision of other normal pregnancy, unspecified trimester: Secondary | ICD-10-CM

## 2017-06-12 NOTE — Progress Notes (Signed)
   PRENATAL VISIT NOTE  Subjective:  Krystal Marshall is a 32 y.o. G3P2002 at 4561w4d being seen today for ongoing prenatal care.  She is currently monitored for the following issues for this low-risk pregnancy and has Language barrier affecting health care; Supervision of other normal pregnancy, antepartum; History of shoulder dystocia in prior pregnancy, currently pregnant; and Placenta previa, marginal on their problem list.  Patient reports no complaints.  Contractions: Not present. Vag. Bleeding: None.  Movement: Present. Denies leaking of fluid.   The following portions of the patient's history were reviewed and updated as appropriate: allergies, current medications, past family history, past medical history, past social history, past surgical history and problem list. Problem list updated.  Objective:   Vitals:   06/12/17 1058  BP: (!) 107/50  Pulse: 71  Weight: 179 lb 14.4 oz (81.6 kg)    Fetal Status: Fetal Heart Rate (bpm): 148 Fundal Height: 25 cm Movement: Present     General:  Alert, oriented and cooperative. Patient is in no acute distress.  Skin: Skin is warm and dry. No rash noted.   Cardiovascular: Normal heart rate noted  Respiratory: Normal respiratory effort, no problems with respiration noted  Abdomen: Soft, gravid, appropriate for gestational age.  Pain/Pressure: Absent     Pelvic: Cervical exam deferred        Extremities: Normal range of motion.  Edema: None  Mental Status:  Normal mood and affect. Normal behavior. Normal judgment and thought content.   Assessment and Plan:  Pregnancy: G3P2002 at 1361w4d  1. Supervision of other normal pregnancy, antepartum  - US MFM OB FOLLOW UP; Future  2. Placenta previa, marginal  - US MFM OB FOLLOW UP; Future - Encouraged pelvic rest  - Bleeding precautions   Preterm labor symptoms and general obstetric precautions including but not limited to vaginal bleeding, contractions, leaking of fluid and fetal movement were  reviewed in detail with the patient. Please refer to After Visit Summary for other counseling recommendations.  Return in about 4 weeks (around 07/10/2017).   Venia CarbonJennifer Rasch, NP

## 2017-06-12 NOTE — Progress Notes (Signed)
[employer] interpreter Claudine [phone]

## 2017-06-12 NOTE — Patient Instructions (Signed)
DTaP Vaccine (Diphtheria, Tetanus, and Pertussis): What You Need to Know 1. Why get vaccinated? Diphtheria, tetanus, and pertussis are serious diseases caused by bacteria. Diphtheria and pertussis are spread from person to person. Tetanus enters the body through cuts or wounds. DIPHTHERIA causes a thick covering in the back of the throat.  It can lead to breathing problems, paralysis, heart failure, and even death.  TETANUS (Lockjaw) causes painful tightening of the muscles, usually all over the body.  It can lead to "locking" of the jaw so the victim cannot open his mouth or swallow. Tetanus leads to death in up to 2 out of 10 cases.  PERTUSSIS (Whooping Cough) causes coughing spells so bad that it is hard for infants to eat, drink, or breathe. These spells can last for weeks.  It can lead to pneumonia, seizures (jerking and staring spells), brain damage, and death.  Diphtheria, tetanus, and pertussis vaccine (DTaP) can help prevent these diseases. Most children who are vaccinated with DTaP will be protected throughout childhood. Many more children would get these diseases if we stopped vaccinating. DTaP is a safer version of an older vaccine called DTP. DTP is no longer used in the [place]. 2. Who should get DTaP vaccine and when? Children should get 5 doses of DTaP vaccine, one dose at each of the following ages:  2 months  4 months  6 months  15-18 months  4-6 years  DTaP may be given at the same time as other vaccines. 3. Some children should not get DTaP vaccine or should wait  Children with minor illnesses, such as a cold, may be vaccinated. But children who are moderately or severely ill should usually wait until they recover before getting DTaP vaccine.  Any child who had a life-threatening allergic reaction after a dose of DTaP should not get another dose.  Any child who suffered a brain or nervous system disease within 7 days after a dose of DTaP should not get  another dose.  Talk with your doctor if your child: ? had a seizure or collapsed after a dose of DTaP, ? cried non-stop for 3 hours or more after a dose of DTaP, ? had a fever over 105F after a dose of DTaP. Ask your doctor for more information. Some of these children should not get another dose of pertussis vaccine, but may get a vaccine without pertussis, called DT. 4. Older children and adults DTaP is not licensed for adolescents, adults, or children 7 years of age and older. But older people still need protection. A vaccine called Tdap is similar to DTaP. A single dose of Tdap is recommended for people 11 through 34 years of age. Another vaccine, called Td, protects against tetanus and diphtheria, but not pertussis. It is recommended every 10 years. There are separate Vaccine Information Statements for these vaccines. 5. What are the risks from DTaP vaccine? Getting diphtheria, tetanus, or pertussis disease is much riskier than getting DTaP vaccine. However, a vaccine, like any medicine, is capable of causing serious problems, such as severe allergic reactions. The risk of DTaP vaccine causing serious harm, or death, is extremely small. Mild problems (common)  Fever (up to about 1 child in 4)  Redness or swelling where the shot was given (up to about 1 child in 4)  Soreness or tenderness where the shot was given (up to about 1 child in 4) These problems occur more often after the 4th and 5th doses of the DTaP series than after   earlier doses. Sometimes the 4th or 5th dose of DTaP vaccine is followed by swelling of the entire arm or leg in which the shot was given, lasting 1-7 days (up to about 1 child in 30). Other mild problems include:  Fussiness (up to about 1 child in 3)  Tiredness or poor appetite (up to about 1 child in 10)  Vomiting (up to about 1 child in 50) These problems generally occur 1-3 days after the shot. Moderate problems (uncommon)  Seizure (jerking or staring)  (about 1 child out of 14,000)  Non-stop crying, for 3 hours or more (up to about 1 child out of 1,000)  High fever, over 105F (about 1 child out of 16,000) Severe problems (very rare)  Serious allergic reaction (less than 1 out of a million doses)  Several other severe problems have been reported after DTaP vaccine. These include: ? Long-term seizures, coma, or lowered consciousness ? Permanent brain damage. These are so rare it is hard to tell if they are caused by the vaccine. Controlling fever is especially important for children who have had seizures, for any reason. It is also important if another family member has had seizures. You can reduce fever and pain by giving your child an aspirin-free pain reliever when the shot is given, and for the next 24 hours, following the package instructions. 6. What if there is a serious reaction? What should I look for? Look for anything that concerns you, such as signs of a severe allergic reaction, very high fever, or behavior changes. Signs of a severe allergic reaction can include hives, swelling of the face and throat, difficulty breathing, a fast heartbeat, dizziness, and weakness. These would start a few minutes to a few hours after the vaccination. What should I do?  If you think it is a severe allergic reaction or other emergency that can't wait, call 9-1-1 or get the person to the nearest hospital. Otherwise, call your doctor.  Afterward, the reaction should be reported to the Vaccine Adverse Event Reporting System (VAERS). Your doctor might file this report, or you can do it yourself through the VAERS web site at www.vaers.hhs.gov, or by calling 1-800-822-7967. ? VAERS is only for reporting reactions. They do not give medical advice. 7. The National Vaccine Injury Compensation Program The National Vaccine Injury Compensation Program (VICP) is a federal program that was created to compensate people who may have been injured by certain  vaccines. Persons who believe they may have been injured by a vaccine can learn about the program and about filing a claim by calling 1-800-338-2382 or visiting the VICP website at www.hrsa.gov/vaccinecompensation. 8. How can I learn more?  Ask your doctor.  Call your local or state health department.  Contact the Centers for Disease Control and Prevention (CDC): ? Call 1-800-232-4636 (1-800-CDC-INFO) or ? Visit CDC's website at www.cdc.gov/vaccines CDC DTaP Vaccine (Diphtheria, Tetanus, and Pertussis) VIS (10/19/05) This information is not intended to replace advice given to you by your health care provider. Make sure you discuss any questions you have with your health care provider. Document Released: 03/19/2006 Document Revised: 02/10/2016 Document Reviewed: 02/10/2016 Elsevier Interactive Patient Education  2017 Elsevier Inc.  

## 2017-06-18 ENCOUNTER — Other Ambulatory Visit: Payer: Self-pay | Admitting: Obstetrics and Gynecology

## 2017-06-18 ENCOUNTER — Ambulatory Visit (HOSPITAL_COMMUNITY)
Admission: RE | Admit: 2017-06-18 | Discharge: 2017-06-18 | Disposition: A | Discharge status: Home / Self Care | Payer: Medicaid Other | Source: Ambulatory Visit | Attending: Obstetrics and Gynecology | Admitting: Obstetrics and Gynecology

## 2017-06-18 DIAGNOSIS — O4402 Placenta previa specified as without hemorrhage, second trimester: Secondary | ICD-10-CM | POA: Diagnosis not present

## 2017-06-18 DIAGNOSIS — Z3482 Encounter for supervision of other normal pregnancy, second trimester: Secondary | ICD-10-CM | POA: Diagnosis present

## 2017-06-18 DIAGNOSIS — Z3480 Encounter for supervision of other normal pregnancy, unspecified trimester: Secondary | ICD-10-CM

## 2017-06-18 DIAGNOSIS — Z3A25 25 weeks gestation of pregnancy: Secondary | ICD-10-CM

## 2017-06-18 DIAGNOSIS — O09892 Supervision of other high risk pregnancies, second trimester: Secondary | ICD-10-CM | POA: Insufficient documentation

## 2017-07-13 ENCOUNTER — Ambulatory Visit (INDEPENDENT_AMBULATORY_CARE_PROVIDER_SITE_OTHER): Payer: Medicaid Other | Admitting: Obstetrics and Gynecology

## 2017-07-13 VITALS — BP 109/58 | HR 86 | Temp 98.6°F | Wt 184.6 lb

## 2017-07-13 DIAGNOSIS — Z3480 Encounter for supervision of other normal pregnancy, unspecified trimester: Secondary | ICD-10-CM

## 2017-07-13 DIAGNOSIS — Z3483 Encounter for supervision of other normal pregnancy, third trimester: Secondary | ICD-10-CM

## 2017-07-13 DIAGNOSIS — O4403 Placenta previa specified as without hemorrhage, third trimester: Secondary | ICD-10-CM

## 2017-07-13 DIAGNOSIS — O4420 Partial placenta previa NOS or without hemorrhage, unspecified trimester: Secondary | ICD-10-CM

## 2017-07-13 NOTE — Progress Notes (Signed)
Pt states may have Flu, has been having body aches, coughing,Fever and headaches.

## 2017-07-13 NOTE — Patient Instructions (Signed)
Cool Mist Vaporizer A cool mist vaporizer is a device that releases a cool mist into the air. If you have a cough or a cold, using a vaporizer may help relieve your symptoms. The mist adds moisture to the air, which may help thin your mucus and make it less sticky. When your mucus is thin and less sticky, it easier for you to breathe and to cough up secretions. Do not use a vaporizer if you are allergic to mold. Follow these instructions at home:  Follow the instructions that come with the vaporizer.  Do not use anything other than distilled water in the vaporizer.  Do not run the vaporizer all of the time. Doing that can cause mold or bacteria to grow in the vaporizer.  Clean the vaporizer after each time that you use it.  Clean and dry the vaporizer well before storing it.  Stop using the vaporizer if your breathing symptoms get worse. This information is not intended to replace advice given to you by your health care provider. Make sure you discuss any questions you have with your health care provider. Document Released: 02/17/2004 Document Revised: 12/10/2015 Document Reviewed: 08/21/2015 Elsevier Interactive Patient Education  2018 Elsevier Inc.  

## 2017-07-13 NOTE — Progress Notes (Signed)
   PRENATAL VISIT NOTE  Subjective:  Krystal Marshall is a 32 y.o. G3P2002 at 6235w0d being seen today for ongoing prenatal care.  She is currently monitored for the following issues for this low-risk pregnancy and has Language barrier affecting health care; Supervision of other normal pregnancy, antepartum; History of shoulder dystocia in prior pregnancy, currently pregnant; and Placenta previa, marginal on their problem list.  Patient reports For 3 days she has had runny nose, cough, feels feverish however did check her temperature. Sickness does not prevent her from being up and moving around. Cough is not keeping her up at night. She did not take any tylenol or ibuprofen.  Contractions: Not present. Vag. Bleeding: None.  Movement: Present. Denies leaking of fluid.   The following portions of the patient's history were reviewed and updated as appropriate: allergies, current medications, past family history, past medical history, past social history, past surgical history and problem list. Problem list updated.  Objective:   Vitals:   07/13/17 0826  BP: (!) 109/58  Pulse: 86  Temp: 98.6 F (37 C)  Weight: 184 lb 9.6 oz (83.7 kg)    Fetal Status: Fetal Heart Rate (bpm): 143 Fundal Height: 29 cm Movement: Present     General:  Alert, oriented and cooperative. Patient is in no acute distress.  Skin: Skin is warm and dry. No rash noted.   Cardiovascular: Normal heart rate noted  Respiratory: Normal respiratory effort, no problems with respiration noted  Abdomen: Soft, gravid, appropriate for gestational age.  Pain/Pressure: Present     Pelvic: Cervical exam deferred        Extremities: Normal range of motion.  Edema: Trace  Mental Status:  Normal mood and affect. Normal behavior. Normal judgment and thought content.   Assessment and Plan:   1. Supervision of other normal pregnancy, antepartum  - CBC - RPR - HIV antibody - Not fasting today, needs 2 hour GTT, she was encouraged to come  back next week for 2 hour GTT fasting.   2. Placenta previa, marginal  - Resolved    Patient with cold symptoms today. No fever. No sick contacts. Had her flu shot. No chills. Sickly viral in nature. Encouraged 60-80 ounces of water, cool mist humidifier. Ok for patient to use plain robitussin, and tylenol OTC, as directed on the bottle. If symptoms worsen she is to go to [facility].   Preterm labor symptoms and general obstetric precautions including but not limited to vaginal bleeding, contractions, leaking of fluid and fetal movement were reviewed in detail with the patient. Please refer to After Visit Summary for other counseling recommendations.  Return in about 1 week (around 07/20/2017) for For 2 hour GTT.  Come fasting .   Venia CarbonJennifer Rasch, NP

## 2017-07-14 LAB — CBC
Hematocrit: 30.7 % — ABNORMAL LOW (ref 34.0–46.6)
Hemoglobin: 10.1 g/dL — ABNORMAL LOW (ref 11.1–15.9)
MCH: 27.1 pg (ref 26.6–33.0)
MCHC: 32.9 g/dL (ref 31.5–35.7)
MCV: 82 fL (ref 79–97)
Platelets: 293 10*3/uL (ref 150–379)
RBC: 3.73 x10E6/uL — ABNORMAL LOW (ref 3.77–5.28)
RDW: 14.2 % (ref 12.3–15.4)
WBC: 9.7 10*3/uL (ref 3.4–10.8)

## 2017-07-14 LAB — HIV ANTIBODY (ROUTINE TESTING W REFLEX): HIV Screen 4th Generation wRfx: NONREACTIVE

## 2017-07-14 LAB — RPR: RPR Ser Ql: NONREACTIVE

## 2017-07-17 ENCOUNTER — Other Ambulatory Visit: Payer: Medicaid Other

## 2017-07-17 ENCOUNTER — Other Ambulatory Visit: Payer: Self-pay | Admitting: *Deleted

## 2017-07-17 DIAGNOSIS — Z3483 Encounter for supervision of other normal pregnancy, third trimester: Secondary | ICD-10-CM

## 2017-07-18 LAB — GLUCOSE TOLERANCE, 2 HOURS W/ 1HR
Glucose, 1 hour: 140 mg/dL (ref 65–179)
Glucose, 2 hour: 102 mg/dL (ref 65–152)
Glucose, Fasting: 79 mg/dL (ref 65–91)

## 2017-07-19 ENCOUNTER — Other Ambulatory Visit: Payer: Medicaid Other

## 2017-07-31 ENCOUNTER — Encounter: Payer: Medicaid Other | Admitting: Family Medicine

## 2017-08-10 ENCOUNTER — Encounter: Payer: Self-pay | Admitting: *Deleted

## 2017-08-14 ENCOUNTER — Encounter: Payer: Self-pay | Admitting: Medical

## 2017-08-14 ENCOUNTER — Ambulatory Visit (INDEPENDENT_AMBULATORY_CARE_PROVIDER_SITE_OTHER): Payer: Medicaid Other | Admitting: Medical

## 2017-08-14 VITALS — BP 119/65 | HR 79 | Wt 186.6 lb

## 2017-08-14 DIAGNOSIS — O4420 Partial placenta previa NOS or without hemorrhage, unspecified trimester: Secondary | ICD-10-CM

## 2017-08-14 DIAGNOSIS — Z3480 Encounter for supervision of other normal pregnancy, unspecified trimester: Secondary | ICD-10-CM

## 2017-08-14 DIAGNOSIS — Z23 Encounter for immunization: Secondary | ICD-10-CM | POA: Diagnosis not present

## 2017-08-14 NOTE — Patient Instructions (Signed)

## 2017-08-14 NOTE — Progress Notes (Signed)
   PRENATAL VISIT NOTE  Subjective:  Krystal Marshall is a 32 y.o. G3P2002 at 2942w4d being seen today for ongoing prenatal care.  She is currently monitored for the following issues for this low-risk pregnancy and has Language barrier affecting health care; Supervision of other normal pregnancy, antepartum; History of shoulder dystocia in prior pregnancy, currently pregnant; and Placenta previa, marginal on their problem list.  Patient reports vaginal odor.  Contractions: Not present. Vag. Bleeding: None.  Movement: Present. Denies leaking of fluid.   The following portions of the patient's history were reviewed and updated as appropriate: allergies, current medications, past family history, past medical history, past social history, past surgical history and problem list. Problem list updated.  Objective:   Vitals:   08/14/17 0931  BP: 119/65  Pulse: 79  Weight: 186 lb 9.6 oz (84.6 kg)    Fetal Status: Fetal Heart Rate (bpm): 145 Fundal Height: 34 cm Movement: Present     General:  Alert, oriented and cooperative. Patient is in no acute distress.  Skin: Skin is warm and dry. No rash noted.   Cardiovascular: Normal heart rate noted  Respiratory: Normal respiratory effort, no problems with respiration noted  Abdomen: Soft, gravid, appropriate for gestational age.  Pain/Pressure: Present     Pelvic: Cervical exam deferred       patient declined pelvic exam  Extremities: Normal range of motion.  Edema: Trace  Mental Status:  Normal mood and affect. Normal behavior. Normal judgment and thought content.   Assessment and Plan:  Pregnancy: G3P2002 at 6142w4d  1. Supervision of other normal pregnancy, antepartum - Tdap vaccine greater than or equal to 7yo IM  2. Placenta previa, marginal - Resolved at last US  Preterm labor symptoms and general obstetric precautions including but not limited to vaginal bleeding, contractions, leaking of fluid and fetal movement were reviewed in detail with  the patient. Please refer to After Visit Summary for other counseling recommendations.  Return in about 2 weeks (around 08/28/2017) for LOB.   Vonzella NippleJulie Wenzel, PA-C

## 2017-08-14 NOTE — Progress Notes (Signed)
Tdap given today 08/14/17 in right arm at 9:40

## 2017-08-22 ENCOUNTER — Ambulatory Visit (INDEPENDENT_AMBULATORY_CARE_PROVIDER_SITE_OTHER): Payer: Medicaid Other | Admitting: Advanced Practice Midwife

## 2017-08-22 ENCOUNTER — Encounter: Payer: Self-pay | Admitting: Advanced Practice Midwife

## 2017-08-22 VITALS — BP 116/62 | HR 79 | Wt 187.3 lb

## 2017-08-22 DIAGNOSIS — Z3480 Encounter for supervision of other normal pregnancy, unspecified trimester: Secondary | ICD-10-CM

## 2017-08-22 DIAGNOSIS — O4420 Partial placenta previa NOS or without hemorrhage, unspecified trimester: Secondary | ICD-10-CM

## 2017-08-22 NOTE — Progress Notes (Signed)
   PRENATAL VISIT NOTE  Subjective:  Krystal Marshall is a 32 y.o. G3P2002 at 753w5d being seen today for ongoing prenatal care.  She is currently monitored for the following issues for this low-risk pregnancy and has Language barrier affecting health care; Supervision of other normal pregnancy, antepartum; History of shoulder dystocia in prior pregnancy, currently pregnant; and Placenta previa, marginal on their problem list.  Patient reports no complaints.  Contractions: Not present. Vag. Bleeding: None.  Movement: Present. Denies leaking of fluid.   The following portions of the patient's history were reviewed and updated as appropriate: allergies, current medications, past family history, past medical history, past social history, past surgical history and problem list. Problem list updated.  Objective:   Vitals:   08/22/17 1025  BP: 116/62  Pulse: 79  Weight: 187 lb 4.8 oz (85 kg)    Fetal Status: Fetal Heart Rate (bpm): 138 Fundal Height: 34 cm Movement: Present     General:  Alert, oriented and cooperative. Patient is in no acute distress.  Skin: Skin is warm and dry. No rash noted.   Cardiovascular: Normal heart rate noted  Respiratory: Normal respiratory effort, no problems with respiration noted  Abdomen: Soft, gravid, appropriate for gestational age.  Pain/Pressure: Present     Pelvic: Cervical exam deferred        Extremities: Normal range of motion.  Edema: Trace  Mental Status:  Normal mood and affect. Normal behavior. Normal judgment and thought content.   Assessment and Plan:  Pregnancy: G3P2002 at 453w5d  1. Supervision of other normal pregnancy, antepartum - Routine care GBS at [place]  2. Placenta previa, marginal RESOLVED   Preterm labor symptoms and general obstetric precautions including but not limited to vaginal bleeding, contractions, leaking of fluid and fetal movement were reviewed in detail with the patient. Please refer to After Visit Summary for other  counseling recommendations.  Return in about 2 weeks (around 09/05/2017).   Thressa ShellerHeather Kaziah Krizek, CNM

## 2017-09-04 ENCOUNTER — Encounter: Payer: Medicaid Other | Admitting: Medical

## 2017-09-10 ENCOUNTER — Other Ambulatory Visit (HOSPITAL_COMMUNITY)
Admission: RE | Admit: 2017-09-10 | Discharge: 2017-09-10 | Disposition: A | Discharge status: Home / Self Care | Payer: Medicaid Other | Source: Ambulatory Visit | Attending: Medical | Admitting: Medical

## 2017-09-10 ENCOUNTER — Ambulatory Visit (INDEPENDENT_AMBULATORY_CARE_PROVIDER_SITE_OTHER): Payer: Medicaid Other | Admitting: Advanced Practice Midwife

## 2017-09-10 ENCOUNTER — Encounter: Payer: Self-pay | Admitting: Advanced Practice Midwife

## 2017-09-10 VITALS — BP 120/72 | HR 83 | Wt 187.0 lb

## 2017-09-10 DIAGNOSIS — Z3483 Encounter for supervision of other normal pregnancy, third trimester: Secondary | ICD-10-CM | POA: Diagnosis not present

## 2017-09-10 NOTE — Progress Notes (Signed)
Per bedside US fetal presentation is complete breech - head on maternal RUQ.

## 2017-09-10 NOTE — Progress Notes (Signed)
   PRENATAL VISIT NOTE  Subjective:  Krystal Marshall is a 32 y.o. G3P2002 at 649w3d being seen today for ongoing prenatal care.  She is currently monitored for the following issues for this low-risk pregnancy and has Language barrier affecting health care; Supervision of other normal pregnancy, antepartum; and History of shoulder dystocia in prior pregnancy, currently pregnant on their problem list.  Patient reports no complaints.  Contractions: Not present. Vag. Bleeding: None.  Movement: Present. Denies leaking of fluid.   The following portions of the patient's history were reviewed and updated as appropriate: allergies, current medications, past family history, past medical history, past social history, past surgical history and problem list. Problem list updated.  Objective:   Vitals:   09/10/17 1338  BP: 120/72  Pulse: 83  Weight: 187 lb (84.8 kg)    Fetal Status: Fetal Heart Rate (bpm): 138 Fundal Height: 36 cm Movement: Present  Presentation: Complete Breech  General:  Alert, oriented and cooperative. Patient is in no acute distress.  Skin: Skin is warm and dry. No rash noted.   Cardiovascular: Normal heart rate noted  Respiratory: Normal respiratory effort, no problems with respiration noted  Abdomen: Soft, gravid, appropriate for gestational age.  Pain/Pressure: Present     Pelvic: Cervical exam performed Dilation: 1 Effacement (%): Thick Station: Ballotable  Extremities: Normal range of motion.  Edema: Trace  Mental Status: Normal mood and affect. Normal behavior. Normal judgment and thought content.   Assessment and Plan:  Pregnancy: G3P2002 at 5549w3d  1. Encounter for supervision of other normal pregnancy in third trimester - Strep Gp B NAA - Cervicovaginal ancillary only - unable to determine presentation today.  - Breech by US today  - [facility]18[facility]16 at [facility]  Term labor symptoms and general obstetric precautions including but not limited  to vaginal bleeding, contractions, leaking of fluid and fetal movement were reviewed in detail with the patient. Please refer to After Visit Summary for other counseling recommendations.  Return in about 1 week (around 09/17/2017).  Future Appointments  Date Time Provider Department Center  09/18/2017  3:35 PM Degele, Kandra NicolasJulie P, MD [facility]  09/24/2017  2:35 PM Armando ReichertHogan, Markiesha Delia D, CNM [facility]  10/01/2017 10:15 AM Allie Bossierove, [facility]ra C, [facility]    Thressa ShellerHeather Kestrel Mis, CNM

## 2017-09-10 NOTE — Patient Instructions (Signed)
Vaginal delivery means that you will give birth by pushing your baby out of your birth canal (vagina). A team of health care providers will help you before, during, and after vaginal delivery. Birth experiences are unique for every woman and every pregnancy, and birth experiences vary depending on where you choose to give birth. What should I do to prepare for my baby's birth? Before your baby is born, it is important to talk with your health care provider about:  Your labor and delivery preferences. These may include: ? Medicines that you may be given. ? How you will manage your pain. This might include non-medical pain relief techniques or injectable pain relief such as epidural analgesia. ? How you and your baby will be monitored during labor and delivery. ? Who may be in the labor and delivery room with you. ? Your feelings about surgical delivery of your baby (cesarean delivery, or C-section) if this becomes necessary. ? Your feelings about receiving donated blood through an IV tube (blood transfusion) if this becomes necessary.  Whether you are able: ? To take pictures or videos of the birth. ? To eat during labor and delivery. ? To move around, walk, or change positions during labor and delivery.  What to expect after your baby is born, such as: ? Whether delayed umbilical cord clamping and cutting is offered. ? Who will care for your baby right after birth. ? Medicines or tests that may be recommended for your baby. ? Whether breastfeeding is supported in your hospital or birth center. ? How long you will be in the hospital or birth center.  How any medical conditions you have may affect your baby or your labor and delivery experience.  To prepare for your baby's birth, you should also:  Attend all of your health care visits before delivery (prenatal visits) as recommended by your health care provider. This is important.  Prepare your home for your baby's arrival. Make sure  that you have: ? Diapers. ? Baby clothing. ? Feeding equipment. ? Safe sleeping arrangements for you and your baby.  Install a car seat in your vehicle. Have your car seat checked by a certified car seat installer to make sure that it is installed safely.  Think about who will help you with your new baby at home for at least the first several weeks after delivery.  What can I expect when I arrive at the birth center or hospital? Once you are in labor and have been admitted into the hospital or birth center, your health care provider may:  Review your pregnancy history and any concerns you have.  Insert an IV tube into one of your veins. This is used to give you fluids and medicines.  Check your blood pressure, pulse, temperature, and heart rate (vital signs).  Check whether your bag of water (amniotic sac) has broken (ruptured).  Talk with you about your birth plan and discuss pain control options.  Monitoring Your health care provider may monitor your contractions (uterine monitoring) and your baby's heart rate (fetal monitoring). You may need to be monitored:  Often, but not continuously (intermittently).  All the time or for long periods at a time (continuously). Continuous monitoring may be needed if: ? You are taking certain medicines, such as medicine to relieve pain or make your contractions stronger. ? You have pregnancy or labor complications.  Monitoring may be done by:  Placing a special stethoscope or a handheld monitoring device on your abdomen to check your   baby's heartbeat, and feeling your abdomen for contractions. This method of monitoring does not continuously record your baby's heartbeat or your contractions.  Placing monitors on your abdomen (external monitors) to record your baby's heartbeat and the frequency and length of contractions. You may not have to wear external monitors all the time.  Placing monitors inside of your uterus (internal monitors) to  record your baby's heartbeat and the frequency, length, and strength of your contractions. ? Your health care provider may use internal monitors if he or she needs more information about the strength of your contractions or your baby's heart rate. ? Internal monitors are put in place by passing a thin, flexible wire through your vagina and into your uterus. Depending on the type of monitor, it may remain in your uterus or on your baby's head until birth. ? Your health care provider will discuss the benefits and risks of internal monitoring with you and will ask for your permission before inserting the monitors.  Telemetry. This is a type of continuous monitoring that can be done with external or internal monitors. Instead of having to stay in bed, you are able to move around during telemetry. Ask your health care provider if telemetry is an option for you.  Physical exam Your health care provider may perform a physical exam. This may include:  Checking whether your baby is positioned: ? With the head toward your vagina (head-down). This is most common. ? With the head toward the top of your uterus (head-up or breech). If your baby is in a breech position, your health care provider may try to turn your baby to a head-down position so you can deliver vaginally. If it does not seem that your baby can be born vaginally, your provider may recommend surgery to deliver your baby. In rare cases, you may be able to deliver vaginally if your baby is head-up (breech delivery). ? Lying sideways (transverse). Babies that are lying sideways cannot be delivered vaginally.  Checking your cervix to determine: ? Whether it is thinning out (effacing). ? Whether it is opening up (dilating). ? How low your baby has moved into your birth canal.  What are the three stages of labor and delivery?  Normal labor and delivery is divided into the following three stages: Stage 1  Stage 1 is the longest stage of labor,  and it can last for hours or days. Stage 1 includes: ? Early labor. This is when contractions may be irregular, or regular and mild. Generally, early labor contractions are more than 10 minutes apart. ? Active labor. This is when contractions get longer, more regular, more frequent, and more intense. ? The transition phase. This is when contractions happen very close together, are very intense, and may last longer than during any other part of labor.  Contractions generally feel mild, infrequent, and irregular at first. They get stronger, more frequent (about every 2-3 minutes), and more regular as you progress from early labor through active labor and transition.  Many women progress through stage 1 naturally, but you may need help to continue making progress. If this happens, your health care provider may talk with you about: ? Rupturing your amniotic sac if it has not ruptured yet. ? Giving you medicine to help make your contractions stronger and more frequent.  Stage 1 ends when your cervix is completely dilated to 4 inches (10 cm) and completely effaced. This happens at the end of the transition phase. Stage 2  Once your cervix   is completely effaced and dilated to 4 inches (10 cm), you may start to feel an urge to push. It is common for the body to naturally take a rest before feeling the urge to push, especially if you received an epidural or certain other pain medicines. This rest period may last for up to 1-2 hours, depending on your unique labor experience.  During stage 2, contractions are generally less painful, because pushing helps relieve contraction pain. Instead of contraction pain, you may feel stretching and burning pain, especially when the widest part of your baby's head passes through the vaginal opening (crowning).  Your health care provider will closely monitor your pushing progress and your baby's progress through the vagina during stage 2.  Your health care provider may  massage the area of skin between your vaginal opening and anus (perineum) or apply warm compresses to your perineum. This helps it stretch as the baby's head starts to crown, which can help prevent perineal tearing. ? In some cases, an incision may be made in your perineum (episiotomy) to allow the baby to pass through the vaginal opening. An episiotomy helps to make the opening of the vagina larger to allow more room for the baby to fit through.  It is very important to breathe and focus so your health care provider can control the delivery of your baby's head. Your health care provider may have you decrease the intensity of your pushing, to help prevent perineal tearing.  After delivery of your baby's head, the shoulders and the rest of the body generally deliver very quickly and without difficulty.  Once your baby is delivered, the umbilical cord may be cut right away, or this may be delayed for 1-2 minutes, depending on your baby's health. This may vary among health care providers, hospitals, and birth centers.  If you and your baby are healthy enough, your baby may be placed on your chest or abdomen to help maintain the baby's temperature and to help you bond with each other. Some mothers and babies start breastfeeding at this time. Your health care team will dry your baby and help keep your baby warm during this time.  Your baby may need immediate care if he or she: ? Showed signs of distress during labor. ? Has a medical condition. ? Was born too early (prematurely). ? Had a bowel movement before birth (meconium). ? Shows signs of difficulty transitioning from being inside the uterus to being outside of the uterus. If you are planning to breastfeed, your health care team will help you begin a feeding. Stage 3  The third stage of labor starts immediately after the birth of your baby and ends after you deliver the placenta. The placenta is an organ that develops during pregnancy to provide  oxygen and nutrients to your baby in the womb.  Delivering the placenta may require some pushing, and you may have mild contractions. Breastfeeding can stimulate contractions to help you deliver the placenta.  After the placenta is delivered, your uterus should tighten (contract) and become firm. This helps to stop bleeding in your uterus. To help your uterus contract and to control bleeding, your health care provider may: ? Give you medicine by injection, through an IV tube, by mouth, or through your rectum (rectally). ? Massage your abdomen or perform a vaginal exam to remove any blood clots that are left in your uterus. ? Empty your bladder by placing a thin, flexible tube (catheter) into your bladder. ? Encourage you to   breastfeed your baby. After labor is over, you and your baby will be monitored closely to ensure that you are both healthy until you are ready to go home. Your health care team will teach you how to care for yourself and your baby. This information is not intended to replace advice given to you by your health care provider. Make sure you discuss any questions you have with your health care provider. Document Released: 02/29/2008 Document Revised: 12/10/2015 Document Reviewed: 06/06/2015 Elsevier Interactive Patient Education  2018 [employer]  

## 2017-09-11 LAB — CERVICOVAGINAL ANCILLARY ONLY
Chlamydia: NEGATIVE
Neisseria Gonorrhea: NEGATIVE

## 2017-09-12 LAB — STREP GP B NAA: Strep Gp B NAA: POSITIVE — AB

## 2017-09-13 ENCOUNTER — Encounter (HOSPITAL_COMMUNITY): Payer: Self-pay | Admitting: *Deleted

## 2017-09-13 ENCOUNTER — Telehealth (HOSPITAL_COMMUNITY): Payer: Self-pay | Admitting: *Deleted

## 2017-09-13 NOTE — Telephone Encounter (Signed)
Preadmission screen Interpreter number [id number]  

## 2017-09-14 ENCOUNTER — Other Ambulatory Visit: Payer: Self-pay | Admitting: Family Medicine

## 2017-09-18 ENCOUNTER — Encounter: Payer: Medicaid Other | Admitting: Family Medicine

## 2017-09-18 ENCOUNTER — Observation Stay (HOSPITAL_COMMUNITY)
Admission: RE | Admit: 2017-09-18 | Discharge: 2017-09-18 | Disposition: A | Discharge status: Home / Self Care | Payer: Medicaid Other | Source: Ambulatory Visit | Attending: Family Medicine | Admitting: Family Medicine

## 2017-09-18 NOTE — Progress Notes (Signed)
Pt called to see if she was going to come in for version that was scheduled at 0800. No answer, left message. Discussed with Dr. Adrian BlackwaterStinson no show and was planning to call.

## 2017-09-24 ENCOUNTER — Ambulatory Visit (INDEPENDENT_AMBULATORY_CARE_PROVIDER_SITE_OTHER): Payer: Medicaid Other | Admitting: Advanced Practice Midwife

## 2017-09-24 VITALS — BP 128/70 | HR 84 | Wt 190.8 lb

## 2017-09-24 DIAGNOSIS — O321XX0 Maternal care for breech presentation, not applicable or unspecified: Secondary | ICD-10-CM

## 2017-09-24 DIAGNOSIS — Z3483 Encounter for supervision of other normal pregnancy, third trimester: Secondary | ICD-10-CM

## 2017-09-24 HISTORY — DX: Maternal care for breech presentation, not applicable or unspecified: O32.1XX0

## 2017-09-24 NOTE — Progress Notes (Signed)
   PRENATAL VISIT NOTE  Subjective:  Krystal Marshall is a 32 y.o. G3P2002 at [redacted]w[redacted]d being seen today for ongoing prenatal care.  She is currently monitored for the following issues for this low-risk pregnancy and has Language barrier affecting health care; Supervision of other normal pregnancy, antepartum; History of shoulder dystocia in prior pregnancy, currently pregnant; and Breech presentation, no version on their problem list.  Patient reports no complaints.  Contractions: Not present. Vag. Bleeding: None.  Movement: Present. Denies leaking of fluid.   Patient states that she did not show for the version because she was too nervous about it. She states that she also felt a lot of movement, and thinks the baby turned.   The following portions of the patient's history were reviewed and updated as appropriate: allergies, current medications, past family history, past medical history, past social history, past surgical history and problem list. Problem list updated.  Objective:   Vitals:   09/24/17 1501  BP: 128/70  Pulse: 84  Weight: 190 lb 12.8 oz (86.5 kg)    Fetal Status: Fetal Heart Rate (bpm): 138    Movement: Present     General:  Alert, oriented and cooperative. Patient is in no acute distress.  Skin: Skin is warm and dry. No rash noted.   Cardiovascular: Normal heart rate noted  Respiratory: Normal respiratory effort, no problems with respiration noted  Abdomen: Soft, gravid, appropriate for gestational age.  Pain/Pressure: Present     Pelvic: Cervical exam deferred        Extremities: Normal range of motion.  Edema: Trace  Mental Status: Normal mood and affect. Normal behavior. Normal judgment and thought content.   Assessment and Plan:  Pregnancy: G3P2002 at [redacted]w[redacted]d  1. Encounter for supervision of other normal pregnancy in third trimester - Needs BPP/NST at next visit if still pregnant.   2. Breech presentation, single or unspecified fetus - Patient did not show for  version  - VERTEX by US today   Term labor symptoms and general obstetric precautions including but not limited to vaginal bleeding, contractions, leaking of fluid and fetal movement were reviewed in detail with the patient. Please refer to After Visit Summary for other counseling recommendations.  Return in about 1 week (around 10/01/2017).  Future Appointments  Date Time Provider Department Center  10/01/2017 10:15 AM Allie Bossierove, [facility] C[facility]    Thressa ShellerHeather Lakayla Barrington, CNM

## 2017-10-01 ENCOUNTER — Ambulatory Visit (INDEPENDENT_AMBULATORY_CARE_PROVIDER_SITE_OTHER): Payer: Medicaid Other | Admitting: Obstetrics & Gynecology

## 2017-10-01 ENCOUNTER — Ambulatory Visit (INDEPENDENT_AMBULATORY_CARE_PROVIDER_SITE_OTHER): Payer: Medicaid Other | Admitting: *Deleted

## 2017-10-01 ENCOUNTER — Telehealth (HOSPITAL_COMMUNITY): Payer: Self-pay | Admitting: *Deleted

## 2017-10-01 ENCOUNTER — Ambulatory Visit: Payer: Self-pay

## 2017-10-01 VITALS — BP 135/80 | HR 75 | Wt 191.2 lb

## 2017-10-01 DIAGNOSIS — Z789 Other specified health status: Secondary | ICD-10-CM

## 2017-10-01 DIAGNOSIS — O480 Post-term pregnancy: Secondary | ICD-10-CM | POA: Diagnosis not present

## 2017-10-01 DIAGNOSIS — O09293 Supervision of pregnancy with other poor reproductive or obstetric history, third trimester: Secondary | ICD-10-CM

## 2017-10-01 DIAGNOSIS — O09299 Supervision of pregnancy with other poor reproductive or obstetric history, unspecified trimester: Secondary | ICD-10-CM

## 2017-10-01 DIAGNOSIS — Z3480 Encounter for supervision of other normal pregnancy, unspecified trimester: Secondary | ICD-10-CM

## 2017-10-01 NOTE — Telephone Encounter (Signed)
[id number] interpreter number

## 2017-10-01 NOTE — Progress Notes (Signed)

## 2017-10-01 NOTE — Progress Notes (Signed)
   PRENATAL VISIT NOTE  Subjective:  Krystal Marshall is a 32 y.o. G3P2002 at [redacted]w[redacted]d being seen today for ongoing prenatal care.  She is currently monitored for the following issues for this low-risk pregnancy and has Language barrier affecting health care; Supervision of other normal pregnancy, antepartum; History of shoulder dystocia in prior pregnancy, currently pregnant; and Breech presentation, no version on their problem list.  Patient reports no complaints.  Contractions: Not present. Vag. Bleeding: None.  Movement: Present. Denies leaking of fluid.   The following portions of the patient's history were reviewed and updated as appropriate: allergies, current medications, past family history, past medical history, past social history, past surgical history and problem list. Problem list updated.  Objective:   Vitals:   10/01/17 1037  BP: 135/80  Pulse: 75  Weight: 191 lb 3.2 oz (86.7 kg)    Fetal Status: Fetal Heart Rate (bpm): NST   Movement: Present     General:  Alert, oriented and cooperative. Patient is in no acute distress.  Skin: Skin is warm and dry. No rash noted.   Cardiovascular: Normal heart rate noted  Respiratory: Normal respiratory effort, no problems with respiration noted  Abdomen: Soft, gravid, appropriate for gestational age.  Pain/Pressure: Present     Pelvic: Cervical exam performed        Extremities: Normal range of motion.  Edema: Trace  Mental Status: Normal mood and affect. Normal behavior. Normal judgment and thought content.   Assessment and Plan:  Pregnancy: G3P2002 at [redacted]w[redacted]d  1. Supervision of other normal pregnancy, antepartum   2. Language barrier affecting health care - Live interpretor used for visit  3. History of shoulder dystocia in prior pregnancy, currently pregnant - plans TOLAC, Foley bulb IOL on Thursday, Friday am IOL at L&D  Term labor symptoms and general obstetric precautions including but not limited to vaginal bleeding,  contractions, leaking of fluid and fetal movement were reviewed in detail with the patient. Please refer to After Visit Summary for other counseling recommendations.  No follow-ups on file.  Future Appointments  Date Time Provider Department Center  10/01/2017 11:15 AM [facility]    Allie Bossier, MD

## 2017-10-03 ENCOUNTER — Inpatient Hospital Stay (HOSPITAL_COMMUNITY)
Admission: AD | Admit: 2017-10-03 | Discharge: 2017-10-05 | DRG: 787 | Disposition: A | Discharge status: Home / Self Care | Payer: Medicaid Other | Source: Ambulatory Visit | Attending: Obstetrics and Gynecology | Admitting: Obstetrics and Gynecology

## 2017-10-03 ENCOUNTER — Inpatient Hospital Stay (HOSPITAL_COMMUNITY): Payer: Medicaid Other | Admitting: Anesthesiology

## 2017-10-03 ENCOUNTER — Encounter (HOSPITAL_COMMUNITY): Payer: Self-pay | Admitting: Anesthesiology

## 2017-10-03 ENCOUNTER — Encounter (HOSPITAL_COMMUNITY)
Admission: AD | Disposition: A | Discharge status: Home / Self Care | Payer: Self-pay | Source: Ambulatory Visit | Attending: Obstetrics and Gynecology

## 2017-10-03 DIAGNOSIS — O321XX0 Maternal care for breech presentation, not applicable or unspecified: Secondary | ICD-10-CM | POA: Diagnosis present

## 2017-10-03 DIAGNOSIS — O99824 Streptococcus B carrier state complicating childbirth: Secondary | ICD-10-CM | POA: Diagnosis present

## 2017-10-03 DIAGNOSIS — O480 Post-term pregnancy: Secondary | ICD-10-CM | POA: Diagnosis present

## 2017-10-03 DIAGNOSIS — O9081 Anemia of the puerperium: Secondary | ICD-10-CM | POA: Diagnosis not present

## 2017-10-03 DIAGNOSIS — O322XX0 Maternal care for transverse and oblique lie, not applicable or unspecified: Secondary | ICD-10-CM

## 2017-10-03 DIAGNOSIS — Z98891 History of uterine scar from previous surgery: Secondary | ICD-10-CM

## 2017-10-03 DIAGNOSIS — Z3A40 40 weeks gestation of pregnancy: Secondary | ICD-10-CM | POA: Diagnosis not present

## 2017-10-03 HISTORY — DX: Post-term pregnancy: O48.0

## 2017-10-03 HISTORY — DX: History of uterine scar from previous surgery: Z98.891

## 2017-10-03 LAB — CBC
HCT: 31.8 % — ABNORMAL LOW (ref 36.0–46.0)
HCT: 24.2 % — ABNORMAL LOW (ref 36.0–46.0)
Hemoglobin: 10.3 g/dL — ABNORMAL LOW (ref 12.0–15.0)
Hemoglobin: 8 g/dL — ABNORMAL LOW (ref 12.0–15.0)
MCH: 24.1 pg — ABNORMAL LOW (ref 26.0–34.0)
MCH: 24.8 pg — ABNORMAL LOW (ref 26.0–34.0)
MCHC: 32.4 g/dL (ref 30.0–36.0)
MCHC: 33.1 g/dL (ref 30.0–36.0)
MCV: 74.5 fL — ABNORMAL LOW (ref 78.0–100.0)
MCV: 75.2 fL — ABNORMAL LOW (ref 78.0–100.0)
Platelets: 323 10*3/uL (ref 150–400)
Platelets: 247 10*3/uL (ref 150–400)
RBC: 4.27 MIL/uL (ref 3.87–5.11)
RBC: 3.22 MIL/uL — ABNORMAL LOW (ref 3.87–5.11)
RDW: 15.7 % — ABNORMAL HIGH (ref 11.5–15.5)
RDW: 15.6 % — ABNORMAL HIGH (ref 11.5–15.5)
WBC: 11.5 10*3/uL — ABNORMAL HIGH (ref 4.0–10.5)
WBC: 15.8 10*3/uL — ABNORMAL HIGH (ref 4.0–10.5)

## 2017-10-03 LAB — CREATININE, SERUM
Creatinine, Ser: 0.38 mg/dL — ABNORMAL LOW (ref 0.44–1.00)
GFR calc Af Amer: >60 mL/min (ref >60)
GFR calc non Af Amer: >60 mL/min (ref >60)

## 2017-10-03 LAB — RPR: RPR Ser Ql: NONREACTIVE

## 2017-10-03 SURGERY — Surgical Case
Anesthesia: Spinal

## 2017-10-03 MED ORDER — SIMETHICONE 80 MG PO CHEW
80.0000 mg | CHEWABLE_TABLET | Freq: Three times a day (TID) | ORAL | Status: DC
  Filled 2017-10-03: qty 1

## 2017-10-03 MED ORDER — OXYTOCIN BOLUS FROM INFUSION: 500.0000 mL | Freq: Once | INTRAVENOUS | Status: DC

## 2017-10-03 MED ORDER — COCONUT OIL OIL: 1.0000 "application " | TOPICAL_OIL | Status: DC | PRN

## 2017-10-03 MED ORDER — DEXAMETHASONE SODIUM PHOSPHATE 10 MG/ML IJ SOLN
INTRAMUSCULAR | Status: DC | PRN
  Administered 2017-10-03: 4 mg via INTRAVENOUS

## 2017-10-03 MED ORDER — MORPHINE SULFATE (PF) 0.5 MG/ML IJ SOLN
INTRAMUSCULAR | Status: DC | PRN
  Administered 2017-10-03: .2 mg via INTRATHECAL

## 2017-10-03 MED ORDER — ACETAMINOPHEN 325 MG PO TABS: 650.0000 mg | ORAL_TABLET | ORAL | Status: DC | PRN

## 2017-10-03 MED ORDER — LACTATED RINGERS IV SOLN
INTRAVENOUS | Status: DC
  Administered 2017-10-03 (×3): via INTRAVENOUS

## 2017-10-03 MED ORDER — DIBUCAINE 1 % RE OINT: 1.0000 "application " | TOPICAL_OINTMENT | RECTAL | Status: DC | PRN

## 2017-10-03 MED ORDER — FENTANYL CITRATE (PF) 100 MCG/2ML IJ SOLN
INTRAMUSCULAR | Status: AC
  Filled 2017-10-03: qty 2

## 2017-10-03 MED ORDER — DIPHENHYDRAMINE HCL 50 MG/ML IJ SOLN: 12.5000 mg | INTRAMUSCULAR | Status: DC | PRN

## 2017-10-03 MED ORDER — PHENYLEPHRINE HCL 10 MG/ML IJ SOLN
INTRAMUSCULAR | Status: DC | PRN
  Administered 2017-10-03 (×3): 80 ug via INTRAVENOUS

## 2017-10-03 MED ORDER — OXYCODONE-ACETAMINOPHEN 5-325 MG PO TABS: 1.0000 | ORAL_TABLET | ORAL | Status: DC | PRN

## 2017-10-03 MED ORDER — MENTHOL 3 MG MT LOZG: 1.0000 | LOZENGE | OROMUCOSAL | Status: DC | PRN

## 2017-10-03 MED ORDER — LACTATED RINGERS IV SOLN
INTRAVENOUS | Status: DC | PRN
  Administered 2017-10-03: 07:00:00 via INTRAVENOUS

## 2017-10-03 MED ORDER — OXYTOCIN 10 UNIT/ML IJ SOLN
INTRAVENOUS | Status: DC | PRN
  Administered 2017-10-03: 40 [IU] via INTRAVENOUS

## 2017-10-03 MED ORDER — ONDANSETRON HCL 4 MG/2ML IJ SOLN
INTRAMUSCULAR | Status: AC
  Filled 2017-10-03: qty 2

## 2017-10-03 MED ORDER — KETOROLAC TROMETHAMINE 30 MG/ML IJ SOLN: 30.0000 mg | Freq: Four times a day (QID) | INTRAMUSCULAR | Status: AC | PRN

## 2017-10-03 MED ORDER — DEXAMETHASONE SODIUM PHOSPHATE 4 MG/ML IJ SOLN
INTRAMUSCULAR | Status: AC
  Filled 2017-10-03: qty 1

## 2017-10-03 MED ORDER — ACETAMINOPHEN 325 MG PO TABS
650.0000 mg | ORAL_TABLET | ORAL | Status: DC | PRN
Start: 2017-10-03 — End: 2017-10-05

## 2017-10-03 MED ORDER — SOD CITRATE-CITRIC ACID 500-334 MG/5ML PO SOLN
ORAL | Status: AC
  Filled 2017-10-03: qty 15

## 2017-10-03 MED ORDER — SODIUM CHLORIDE 0.9 % IR SOLN
Status: DC | PRN
  Administered 2017-10-03: 1

## 2017-10-03 MED ORDER — IBUPROFEN 600 MG PO TABS: 600.0000 mg | ORAL_TABLET | Freq: Four times a day (QID) | ORAL | Status: DC

## 2017-10-03 MED ORDER — ZOLPIDEM TARTRATE 5 MG PO TABS: 5.0000 mg | ORAL_TABLET | Freq: Every evening | ORAL | Status: DC | PRN

## 2017-10-03 MED ORDER — LACTATED RINGERS IV SOLN: INTRAVENOUS | Status: DC

## 2017-10-03 MED ORDER — NALBUPHINE HCL 10 MG/ML IJ SOLN: 5.0000 mg | Freq: Once | INTRAMUSCULAR | Status: DC | PRN

## 2017-10-03 MED ORDER — MORPHINE SULFATE (PF) 0.5 MG/ML IJ SOLN
INTRAMUSCULAR | Status: AC
  Filled 2017-10-03: qty 10

## 2017-10-03 MED ORDER — SIMETHICONE 80 MG PO CHEW: 80.0000 mg | CHEWABLE_TABLET | ORAL | Status: DC | PRN

## 2017-10-03 MED ORDER — KETOROLAC TROMETHAMINE 30 MG/ML IJ SOLN
30.0000 mg | Freq: Four times a day (QID) | INTRAMUSCULAR | Status: AC
  Administered 2017-10-03 – 2017-10-04 (×3): 30 mg via INTRAVENOUS
  Filled 2017-10-03 (×3): qty 1

## 2017-10-03 MED ORDER — PHENYLEPHRINE 8 MG IN D5W 100 ML (0.08MG/ML) PREMIX OPTIME
INJECTION | INTRAVENOUS | Status: AC
  Filled 2017-10-03: qty 100

## 2017-10-03 MED ORDER — KETOROLAC TROMETHAMINE 30 MG/ML IJ SOLN
30.0000 mg | Freq: Four times a day (QID) | INTRAMUSCULAR | Status: AC | PRN
  Administered 2017-10-03: 30 mg via INTRAVENOUS
  Filled 2017-10-03: qty 1

## 2017-10-03 MED ORDER — WITCH HAZEL-GLYCERIN EX PADS: 1.0000 "application " | MEDICATED_PAD | CUTANEOUS | Status: DC | PRN

## 2017-10-03 MED ORDER — MORPHINE SULFATE (PF) 0.5 MG/ML IJ SOLN
INTRAMUSCULAR | Status: AC
Start: 2017-10-03 — End: ?
  Filled 2017-10-03: qty 10

## 2017-10-03 MED ORDER — OXYCODONE HCL 5 MG PO TABS: 10.0000 mg | ORAL_TABLET | ORAL | Status: DC | PRN

## 2017-10-03 MED ORDER — ONDANSETRON HCL 4 MG/2ML IJ SOLN
INTRAMUSCULAR | Status: DC | PRN
  Administered 2017-10-03: 4 mg via INTRAVENOUS

## 2017-10-03 MED ORDER — PHENYLEPHRINE 40 MCG/ML (10ML) SYRINGE FOR IV PUSH (FOR BLOOD PRESSURE SUPPORT)
PREFILLED_SYRINGE | INTRAVENOUS | Status: AC
  Filled 2017-10-03: qty 10

## 2017-10-03 MED ORDER — ONDANSETRON HCL 4 MG/2ML IJ SOLN
4.0000 mg | Freq: Three times a day (TID) | INTRAMUSCULAR | Status: DC | PRN
  Administered 2017-10-03: 4 mg via INTRAVENOUS

## 2017-10-03 MED ORDER — CEFAZOLIN SODIUM-DEXTROSE 2-4 GM/100ML-% IV SOLN
INTRAVENOUS | Status: AC
  Filled 2017-10-03: qty 100

## 2017-10-03 MED ORDER — FENTANYL CITRATE (PF) 100 MCG/2ML IJ SOLN
INTRAMUSCULAR | Status: DC | PRN
  Administered 2017-10-03: 10 ug via INTRATHECAL

## 2017-10-03 MED ORDER — OXYTOCIN 10 UNIT/ML IJ SOLN
INTRAMUSCULAR | Status: AC
  Filled 2017-10-03: qty 4

## 2017-10-03 MED ORDER — OXYCODONE-ACETAMINOPHEN 5-325 MG PO TABS: 2.0000 | ORAL_TABLET | ORAL | Status: DC | PRN

## 2017-10-03 MED ORDER — NALBUPHINE HCL 10 MG/ML IJ SOLN: 5.0000 mg | INTRAMUSCULAR | Status: DC | PRN

## 2017-10-03 MED ORDER — SCOPOLAMINE 1 MG/3DAYS TD PT72
MEDICATED_PATCH | TRANSDERMAL | Status: DC | PRN
  Administered 2017-10-03: 1 via TRANSDERMAL

## 2017-10-03 MED ORDER — SOD CITRATE-CITRIC ACID 500-334 MG/5ML PO SOLN: 30.0000 mL | ORAL | Status: DC | PRN

## 2017-10-03 MED ORDER — SCOPOLAMINE 1 MG/3DAYS TD PT72
1.0000 | MEDICATED_PATCH | Freq: Once | TRANSDERMAL | Status: DC
  Filled 2017-10-03: qty 1

## 2017-10-03 MED ORDER — SCOPOLAMINE 1 MG/3DAYS TD PT72
MEDICATED_PATCH | TRANSDERMAL | Status: AC
Start: 2017-10-03 — End: ?
  Filled 2017-10-03: qty 1

## 2017-10-03 MED ORDER — DIPHENHYDRAMINE HCL 25 MG PO CAPS: 25.0000 mg | ORAL_CAPSULE | Freq: Four times a day (QID) | ORAL | Status: DC | PRN

## 2017-10-03 MED ORDER — LACTATED RINGERS IV SOLN
INTRAVENOUS | Status: DC
  Administered 2017-10-03 – 2017-10-04 (×3): via INTRAVENOUS

## 2017-10-03 MED ORDER — ENOXAPARIN SODIUM 40 MG/0.4ML ~~LOC~~ SOLN
40.0000 mg | SUBCUTANEOUS | Status: DC
  Administered 2017-10-04 – 2017-10-05 (×2): 40 mg via SUBCUTANEOUS
  Filled 2017-10-03 (×2): qty 0.4

## 2017-10-03 MED ORDER — MEPERIDINE HCL 25 MG/ML IJ SOLN: 6.2500 mg | INTRAMUSCULAR | Status: DC | PRN

## 2017-10-03 MED ORDER — PRENATAL MULTIVITAMIN CH
1.0000 | ORAL_TABLET | Freq: Every day | ORAL | Status: DC
  Administered 2017-10-04 – 2017-10-05 (×2): 1 via ORAL
  Filled 2017-10-03 (×2): qty 1

## 2017-10-03 MED ORDER — BUPIVACAINE IN DEXTROSE 0.75-8.25 % IT SOLN
INTRATHECAL | Status: DC | PRN
  Administered 2017-10-03: 1.4 mL via INTRATHECAL

## 2017-10-03 MED ORDER — OXYTOCIN 40 UNITS IN LACTATED RINGERS INFUSION - SIMPLE MED: 2.5000 [IU]/h | INTRAVENOUS | Status: DC

## 2017-10-03 MED ORDER — NALOXONE HCL 4 MG/10ML IJ SOLN
1.0000 ug/kg/h | INTRAVENOUS | Status: DC | PRN
  Filled 2017-10-03: qty 5

## 2017-10-03 MED ORDER — ONDANSETRON HCL 4 MG/2ML IJ SOLN
4.0000 mg | Freq: Four times a day (QID) | INTRAMUSCULAR | Status: DC | PRN
  Filled 2017-10-03: qty 2

## 2017-10-03 MED ORDER — FENTANYL CITRATE (PF) 100 MCG/2ML IJ SOLN: 100.0000 ug | INTRAMUSCULAR | Status: DC | PRN

## 2017-10-03 MED ORDER — SODIUM CHLORIDE 0.9% FLUSH: 3.0000 mL | INTRAVENOUS | Status: DC | PRN

## 2017-10-03 MED ORDER — TETANUS-DIPHTH-ACELL PERTUSSIS 5-2.5-18.5 LF-MCG/0.5 IM SUSP: 0.5000 mL | Freq: Once | INTRAMUSCULAR | Status: DC

## 2017-10-03 MED ORDER — METOCLOPRAMIDE HCL 5 MG/ML IJ SOLN: 10.0000 mg | Freq: Once | INTRAMUSCULAR | Status: DC | PRN

## 2017-10-03 MED ORDER — PHENYLEPHRINE 8 MG IN D5W 100 ML (0.08MG/ML) PREMIX OPTIME
INJECTION | INTRAVENOUS | Status: DC | PRN
  Administered 2017-10-03: 60 ug/min via INTRAVENOUS

## 2017-10-03 MED ORDER — FLEET ENEMA 7-19 GM/118ML RE ENEM: 1.0000 | ENEMA | RECTAL | Status: DC | PRN

## 2017-10-03 MED ORDER — ACETAMINOPHEN 500 MG PO TABS
1000.0000 mg | ORAL_TABLET | Freq: Four times a day (QID) | ORAL | Status: AC
  Administered 2017-10-03 – 2017-10-04 (×2): 1000 mg via ORAL
  Filled 2017-10-03 (×2): qty 2

## 2017-10-03 MED ORDER — OXYCODONE HCL 5 MG PO TABS: 5.0000 mg | ORAL_TABLET | ORAL | Status: DC | PRN

## 2017-10-03 MED ORDER — TERBUTALINE SULFATE 1 MG/ML IJ SOLN
0.2500 mg | Freq: Once | INTRAMUSCULAR | Status: DC
  Filled 2017-10-03: qty 1

## 2017-10-03 MED ORDER — FENTANYL CITRATE (PF) 100 MCG/2ML IJ SOLN: 25.0000 ug | INTRAMUSCULAR | Status: DC | PRN

## 2017-10-03 MED ORDER — SOD CITRATE-CITRIC ACID 500-334 MG/5ML PO SOLN
30.0000 mL | Freq: Once | ORAL | Status: AC
  Administered 2017-10-03: 30 mL via ORAL

## 2017-10-03 MED ORDER — DIPHENHYDRAMINE HCL 25 MG PO CAPS: 25.0000 mg | ORAL_CAPSULE | ORAL | Status: DC | PRN

## 2017-10-03 MED ORDER — DIPHENHYDRAMINE HCL 50 MG/ML IJ SOLN
INTRAMUSCULAR | Status: DC | PRN
  Administered 2017-10-03: 25 mg via INTRAVENOUS

## 2017-10-03 MED ORDER — SIMETHICONE 80 MG PO CHEW
80.0000 mg | CHEWABLE_TABLET | ORAL | Status: DC
  Administered 2017-10-03 – 2017-10-05 (×2): 80 mg via ORAL
  Filled 2017-10-03 (×2): qty 1

## 2017-10-03 MED ORDER — SENNOSIDES-DOCUSATE SODIUM 8.6-50 MG PO TABS
2.0000 | ORAL_TABLET | ORAL | Status: DC
  Administered 2017-10-03 – 2017-10-05 (×2): 2 via ORAL
  Filled 2017-10-03 (×2): qty 2

## 2017-10-03 MED ORDER — NALOXONE HCL 0.4 MG/ML IJ SOLN: 0.4000 mg | INTRAMUSCULAR | Status: DC | PRN

## 2017-10-03 MED ORDER — LIDOCAINE HCL (PF) 1 % IJ SOLN
30.0000 mL | INTRAMUSCULAR | Status: DC | PRN
  Filled 2017-10-03: qty 30

## 2017-10-03 MED ORDER — SCOPOLAMINE 1 MG/3DAYS TD PT72
MEDICATED_PATCH | TRANSDERMAL | Status: AC
  Filled 2017-10-03: qty 1

## 2017-10-03 MED ORDER — LACTATED RINGERS IV SOLN: 500.0000 mL | INTRAVENOUS | Status: DC | PRN

## 2017-10-03 MED ORDER — DIPHENHYDRAMINE HCL 50 MG/ML IJ SOLN
INTRAMUSCULAR | Status: AC
  Filled 2017-10-03: qty 1

## 2017-10-03 MED ORDER — OXYTOCIN 40 UNITS IN LACTATED RINGERS INFUSION - SIMPLE MED: 2.5000 [IU]/h | INTRAVENOUS | Status: AC

## 2017-10-03 SURGICAL SUPPLY — 30 items
CHLORAPREP W/TINT 26ML (MISCELLANEOUS) ×2 IMPLANT
CLAMP CORD UMBIL (MISCELLANEOUS) IMPLANT
DERMABOND ADVANCED (GAUZE/BANDAGES/DRESSINGS) ×1
DERMABOND ADVANCED .7 DNX12 (GAUZE/BANDAGES/DRESSINGS) ×1 IMPLANT
DRSG OPSITE POSTOP 4X10 (GAUZE/BANDAGES/DRESSINGS) ×2 IMPLANT
ELECT REM PT RETURN 9FT ADLT (ELECTROSURGICAL) ×2
ELECTRODE REM PT RTRN 9FT ADLT (ELECTROSURGICAL) ×1 IMPLANT
EXTRACTOR VACUUM M CUP 4 TUBE (SUCTIONS) IMPLANT
GLOVE BIOGEL PI IND STRL 6.5 (GLOVE) ×1 IMPLANT
GLOVE BIOGEL PI IND STRL 7.0 (GLOVE) ×1 IMPLANT
GLOVE BIOGEL PI INDICATOR 6.5 (GLOVE) ×1
GLOVE BIOGEL PI INDICATOR 7.0 (GLOVE) ×1
GLOVE SURG SS PI 6.0 STRL IVOR (GLOVE) ×2 IMPLANT
GOWN STRL REUS W/TWL LRG LVL3 (GOWN DISPOSABLE) ×4 IMPLANT
HEMOSTAT ARISTA ABSORB 3G PWDR (MISCELLANEOUS) ×2 IMPLANT
KIT ABG SYR 3ML LUER SLIP (SYRINGE) ×4 IMPLANT
NEEDLE HYPO 25X5/8 SAFETYGLIDE (NEEDLE) ×4 IMPLANT
NS IRRIG 1000ML POUR BTL (IV SOLUTION) ×2 IMPLANT
PACK C SECTION WH (CUSTOM PROCEDURE TRAY) ×2 IMPLANT
PAD OB MATERNITY 4.3X12.25 (PERSONAL CARE ITEMS) ×2 IMPLANT
PENCIL SMOKE EVAC W/HOLSTER (ELECTROSURGICAL) ×2 IMPLANT
RTRCTR C-SECT PINK 25CM LRG (MISCELLANEOUS) ×2 IMPLANT
SEPRAFILM MEMBRANE 5X6 (MISCELLANEOUS) IMPLANT
SPONGE LAP 18X18 X RAY DECT (DISPOSABLE) ×4 IMPLANT
SUT PLAIN 0 NONE (SUTURE) IMPLANT
SUT VIC AB 0 CT1 36 (SUTURE) ×16 IMPLANT
SUT VIC AB 4-0 KS 27 (SUTURE) ×2 IMPLANT
TOWEL OR 17X24 6PK STRL BLUE (TOWEL DISPOSABLE) ×2 IMPLANT
TRAY FOLEY W/BAG SLVR 14FR LF (SET/KITS/TRAYS/PACK) ×2 IMPLANT
WATER STERILE IRR 1000ML POUR (IV SOLUTION) ×2 IMPLANT

## 2017-10-03 NOTE — Consult Note (Signed)
Neonatology Note:   Attendance at C-section:    I was asked by Dr. Constant to attend this C/S at term for transverse lie. The mother is a G3P2, GBS + with good prenatal care. ROM 3 hours before delivery, fluid clear. Infant not vigorous nor with good spontaneous cry and tone. Cord immediately clamped and infant brought to warmer where she was warmed, dried, and stimulated.  HR <100 and >60. Floppy with no respiratory effort.  Bulb suctioned and started PPV.  SaO2 placed in 100%. Color, tone and cry markedly improved. Within a minute, transitioned to CPAP then removed after ~1minute and infant remained stable on RA, Sao2 100% without WOB. Ap 4/8. Lungs clear to ausc in DR. Mild distal peripheral vasoconstriction.  Good pulses. To CN to care of Pediatrician.  Please feel free to contact us if concerns.  David C. Ehrmann, MD 

## 2017-10-03 NOTE — MAU Note (Signed)
Contractions since 0115

## 2017-10-03 NOTE — Transfer of Care (Signed)
Immediate Anesthesia Transfer of Care Note  Patient: Krystal Marshall  Procedure(s) Performed: CESAREAN SECTION (N/A )  Patient Location: PACU  Anesthesia Type:Spinal  Level of Consciousness: awake, alert  and oriented  Airway & Oxygen Therapy: Patient Spontanous Breathing and Patient connected to nasal cannula oxygen  Post-op Assessment: Report given to RN and Post -op Vital signs reviewed and stable  Post vital signs: Reviewed and stable  Last Vitals:  Vitals Value Taken Time  BP 117/62 10/03/2017  8:15 AM  Temp 36.9 C 10/03/2017  8:15 AM  Pulse 64 10/03/2017  8:19 AM  Resp 14 10/03/2017  8:19 AM  SpO2 100 % 10/03/2017  8:19 AM  Vitals shown include unvalidated device data.  Last Pain:  Vitals:   10/03/17 0815  TempSrc: Oral  PainSc:          Complications: No apparent anesthesia complications

## 2017-10-03 NOTE — Anesthesia Postprocedure Evaluation (Signed)
Anesthesia Post Note  Patient: Krystal Marshall  Procedure(s) Performed: CESAREAN SECTION (N/A )     Patient location during evaluation: PACU Anesthesia Type: Spinal Level of consciousness: oriented and awake and alert Pain management: pain level controlled Vital Signs Assessment: post-procedure vital signs reviewed and stable Respiratory status: spontaneous breathing and respiratory function stable Cardiovascular status: blood pressure returned to baseline and stable Postop Assessment: no headache, no backache and no apparent nausea or vomiting Anesthetic complications: no    Last Vitals:  Vitals:   10/03/17 0914 10/03/17 0923  BP: (!) 108/59 112/61  Pulse: 70 71  Resp: 15 17  Temp:  36.4 C  SpO2:  100%    Last Pain:  Vitals:   10/03/17 0923  TempSrc: Oral  PainSc:    Pain Goal:                 [name]

## 2017-10-03 NOTE — Anesthesia Postprocedure Evaluation (Signed)
Anesthesia Post Note  Patient: Krystal Marshall  Procedure(s) Performed: CESAREAN SECTION (N/A )     Patient location during evaluation: Mother Baby Anesthesia Type: Spinal Level of consciousness: awake and alert Pain management: pain level controlled Vital Signs Assessment: post-procedure vital signs reviewed and stable Respiratory status: spontaneous breathing, nonlabored ventilation and respiratory function stable Cardiovascular status: stable Postop Assessment: no headache, no backache and epidural receding Anesthetic complications: no    Last Vitals:  Vitals:   10/03/17 1138 10/03/17 1245  BP: 105/61 (!) 113/55  Pulse: 67 66  Resp:    Temp: 37.2 C 37.3 C  SpO2: 98% 96%    Last Pain:  Vitals:   10/03/17 1245  TempSrc:   PainSc: 2    Pain Goal:                 [employer]

## 2017-10-03 NOTE — H&P (Signed)
Obstetric Preoperative History and Physical  Krystal Marshall is a 32 y.o. [id number] with IUP at [redacted]w[redacted]d presenting for SROM. However in MAU upon cervical check patient noted to be be vertex. Beside [place] perfromed showing transverse lie with fetal head in RUQ. Patient was scheduled for ECV on 4/16 but did not show up. Unable to do ECV in setting of active labor. No acute concerns.   Prenatal Course Source of Care: [facility] Pregnancy complications or risks: Patient Active Problem List   Diagnosis Date Noted  . Breech presentation, no version 09/24/2017  . Supervision of other normal pregnancy, antepartum Apr 11, 2017  . History of shoulder dystocia in prior pregnancy, currently pregnant 04/11/2017  . Language barrier affecting health care 04/22/2015   She plans to breastfeed She desires Depo-Provera for postpartum contraception.   Prenatal labs and studies: ABO, Rh: AB/Positive/-- 12-Apr-2023 1041) Antibody: Negative 04/12/2023 1041) Rubella: 28.80 04-12-23 1041) RPR: Non Reactive (02/08 1227)  HBsAg: Negative 04/12/2023 1041)  HIV: Non Reactive (02/08 1227)  AVW:UJWJXBJY (04/08 1409) 2 hr Glucola  normal Genetic screening normal Anatomy US normal  Prenatal Transfer Tool  Maternal Diabetes: No Genetic Screening: Normal Maternal Ultrasounds/Referrals: Normal Fetal Ultrasounds or other Referrals:  None Maternal Substance Abuse:  No Significant Maternal Medications:  None Significant Maternal Lab Results: Lab values include: Group B Strep positive  Past Medical History:  Diagnosis Date  . Medical history non-contributory     Past Surgical History:  Procedure Laterality Date  . NO PAST SURGERIES      OB History  Gravida Para Term Preterm AB Living  0 0 2  SAB TAB Ectopic Multiple Live Births  0 0 0 0 2    # Outcome Date GA Lbr Len/2nd Weight Sex Delivery Anes PTL Lv  3 Current           2 Term 09/09/15 [redacted]w[redacted]d 14:38 / 01:21 3.265 kg (7 lb 3.2 oz) F Vag-Spont EPI  LIV  1 Term 04/16/11 [redacted]w[redacted]d  20:11 / 00:58 3.525 kg (7 lb 12.3 oz) M Vag-Spont EPI  LIV    Social History   Socioeconomic History  . Marital status: Married    Spouse name: Not on file  . Number of children: Not on file  . Years of education: Not on file  . Highest education level: Not on file  Occupational History  . Not on file  Social Needs  . Financial resource strain: Not on file  . Food insecurity:    Worry: Not on file    Inability: Not on file  . Transportation needs:    Medical: Not on file    Non-medical: Not on file  Tobacco Use  . Smoking status: Never Smoker  . Smokeless tobacco: Never Used  Substance and Sexual Activity  . Alcohol use: No  . Drug use: No  . Sexual activity: Yes  Lifestyle  . Physical activity:    Days per week: Not on file    Minutes per session: Not on file  . Stress: Not on file  Relationships  . Social connections:    Talks on phone: Not on file    Gets together: Not on file    Attends religious service: Not on file    Active member of club or organization: Not on file    Attends meetings of clubs or organizations: Not on file    Relationship status: Not on file  Other Topics Concern  . Not on file  Social History Narrative   **  Merged History Encounter **        Family History  Problem Relation Age of Onset  . Cancer Neg Hx   . Diabetes Neg Hx   . Hypertension Neg Hx     Medications Prior to Admission  Medication Sig Dispense Refill Last Dose  . Prenatal Multivit-Min-Fe-FA (PRENATAL VITAMINS) 0.8 MG tablet Take 1 tablet by mouth daily. 30 tablet 12 Taking  . Prenatal Vit-Fe Fumarate-FA (PRENATAL MULTIVITAMIN) TABS tablet Take 1 tablet by mouth daily at 12 noon.   Taking    No Known Allergies  Review of Systems: Pertinent items noted in HPI and remainder of comprehensive ROS otherwise negative.  Physical Exam: BP 120/78   Pulse 86   Temp 97.9 F (36.6 C) (Oral)   Resp 17   Ht  (1.651 m)   Wt 86 kg (189 lb 9.5 oz)   LMP  (LMP Unknown)    BMI 31.55 kg/m  CONSTITUTIONAL: Well-developed, well-nourished female in no acute distress.  HENT:  Normocephalic, atraumatic, External right and left ear normal. Oropharynx is clear and moist EYES: Conjunctivae and EOM are normal. Pupils are equal, round, and reactive to light. No scleral icterus.  NECK: Normal range of motion, supple, no masses SKIN: Skin is warm and dry. No rash noted. Not diaphoretic. No erythema. No pallor. NEUROLGIC: Alert and oriented to person, place, and time. Normal reflexes, muscle tone coordination. No cranial nerve deficit noted. PSYCHIATRIC: Normal mood and affect. Normal behavior. Normal judgment and thought content. CARDIOVASCULAR: Normal heart rate noted, regular rhythm RESPIRATORY: Effort and breath sounds normal, no problems with respiration noted ABDOMEN: Soft, nontender, nondistended, gravid.  PELVIC: Deferred Dilation: 6.5 Effacement (%): 80 Station: -3 Presentation: Transverse Exam by:: Karl Ito, rnc  MUSCULOSKELETAL: Normal range of motion. No edema and no tenderness. 2+ distal pulses.  I personally reviewed the patient's NST today, found to be REACTIVE. 135 bpm, mod var, +accels, no decels. CTX: 3-5 min.  Pertinent Labs/Studies:   Results for orders placed or performed during the hospital encounter of 10/03/17 (from the past 72 hour(s))  CBC     Status: Abnormal   Collection Time: 10/03/17  4:26 AM  Result Value Ref Range   WBC 11.5 (H) 4.0 - 10.5 K/uL   RBC 4.27 3.87 - 5.11 MIL/uL   Hemoglobin 10.3 (L) 12.0 - 15.0 g/dL   HCT 40.9 (L) 81.1 - 91.4 %   MCV 74.5 (L) 78.0 - 100.0 fL   MCH 24.1 (L) 26.0 - 34.0 pg   MCHC 32.4 30.0 - 36.0 g/dL   RDW 78.2 (H) 95.6 - 21.3 %   Platelets 323 150 - 400 K/uL    Comment: Performed at [facility], [address] [place], [place] [address]    Assessment and Plan :Krystal Marshall is a 32 y.o. G3P2002 at [redacted]w[redacted]d being admitted for cesarean section due to malpresentation. The risks of  cesarean section discussed with the patient included but were not limited to: bleeding which may require transfusion or reoperation; infection which may require antibiotics; injury to bowel, bladder, ureters or other surrounding organs; injury to the fetus; need for additional procedures including hysterectomy in the event of a life-threatening hemorrhage; placental abnormalities wth subsequent pregnancies, incisional problems, thromboembolic phenomenon and other postoperative/anesthesia complications. The patient concurred with the proposed plan, giving informed written consent for the procedure. Patient has been NPO since last night she will remain NPO for procedure. Anesthesia and OR aware. Preoperative prophylactic antibiotics and SCDs ordered on call  to the OR. To OR when ready.    Caryl Ada, DO OB Fellow Center for [facility], [facility]

## 2017-10-03 NOTE — Lactation Note (Signed)
This note was copied from a baby's chart. Lactation Consultation Note  Patient Name: Krystal Marshall [id number] Date: 10/03/2017 Reason for consult: Initial assessment  Initial visit at 8 hours of life. Mom is a P3 who nursed her 1st 2 children for 2-3 months & 1 yr, 4 months, respectively. She reports that her milk tends to come in on the 3rd day. She denies any issues with breastfeeding her 2 other children. Her other children are now 34yo & 6yo.  Mom reports having WIC. She was made aware that the formula used by [facility] is not halal, so we will give her Similac (which is halal) in the hospital, if formula is needed. [name] was used as the interpreter for consult.  Of note, Mom had a difficult delivery. Her C-Section required both transverse & vertical incisions. Her EBL was .  [name] 10/03/2017, 3:32 PM

## 2017-10-03 NOTE — Op Note (Signed)
Madhuri Brach PROCEDURE DATE: 10/03/2017  PREOPERATIVE DIAGNOSIS: Intrauterine pregnancy at  [redacted]w[redacted]d weeks gestation; malpresentation: transverse back down  POSTOPERATIVE DIAGNOSIS: The same  PROCEDURE:     Cesarean Section (transverse uterine incision with low vertical extension )  SURGEON:  Dr. Catalina Antigua  ASSISTANT: Dr. Doroteo Glassman  INDICATIONS: Krystal Marshall is a 32 y.o. [id number] at [redacted]w[redacted]d scheduled for cesarean section secondary to malpresentation: transverse.  The risks of cesarean section discussed with the patient included but were not limited to: bleeding which may require transfusion or reoperation; infection which may require antibiotics; injury to bowel, bladder, ureters or other surrounding organs; injury to the fetus; need for additional procedures including hysterectomy in the event of a life-threatening hemorrhage; placental abnormalities wth subsequent pregnancies, incisional problems, thromboembolic phenomenon and other postoperative/anesthesia complications. The patient concurred with the proposed plan, giving informed written consent for the procedure.    FINDINGS:  Viable female infant in transverse presentation.  Apgars 4 and 8.  Clear amniotic fluid.  Intact placenta, three vessel cord.  Normal uterus, fallopian tubes and ovaries bilaterally.  ANESTHESIA:    Spinal INTRAVENOUS FLUIDS:3300 ml ESTIMATED BLOOD LOSS: 1740 mL ml URINE OUTPUT:  200 ml SPECIMENS: Placenta sent to L&D COMPLICATIONS: None immediate  PROCEDURE IN DETAIL:  The patient received intravenous antibiotics and had sequential compression devices applied to her lower extremities while in the preoperative area.  She was then taken to the operating room where anesthesia was induced and was found to be adequate. A foley catheter was placed into her bladder and attached to Claudeen Leason gravity. She was then placed in a dorsal supine position with a leftward tilt, and prepped and draped in a sterile manner. After an  adequate timeout was performed, a Pfannenstiel skin incision was made with scalpel and carried through to the underlying layer of fascia. The fascia was incised in the midline and this incision was extended bilaterally using the Mayo scissors. Kocher clamps were applied to the superior aspect of the fascial incision and the underlying rectus muscles were dissected off bluntly. A similar process was carried out on the inferior aspect of the facial incision. The rectus muscles were separated in the midline bluntly and the peritoneum was entered bluntly. The Alexis self-retaining retractor was introduced into the abdominal cavity. Attention was turned to the lower uterine segment where a transverse hysterotomy was made with a scalpel and extended bilaterally bluntly. It was difficult to rotate the infant to vertex or breech presentation. The only limb easily brought to the incision was the right arm. The decision was made to extend the incision vertically. The infant was successfully delivered via breech extractions with the assistance of Dr. Charlotta Newton and Dr. Langston Masker.The cord was clamped and cut and infant was handed over to awaiting neonatology team. Cord gases were sent. Uterine massage was then administered and the placenta delivered intact with three-vessel cord. The uterus was cleared of clot and debris.  The hysterotomy was closed with 0 Vicryl in a running locked fashion starting with the vertical incision, and an imbricating layer was also placed with a 0 Vicryl. Overall, excellent hemostasis was noted. The pelvis copiously irrigated and cleared of all clot and debris. Hemostasis was confirmed on all surfaces.  The peritoneum and the muscles were reapproximated using 0 vicryl interrupted stitches. The fascia was then closed using 0 Vicryl in a running fashion.  The skin was closed in a subcuticular fashion using 3.0 Vicryl. The patient tolerated the procedure well. Sponge, lap, instrument and needle  counts were  correct x 2. She was taken to the recovery room in stable condition.    An experienced assistant was required given the standard of surgical care and the complexity of the case.  This assistant was needed for exposure, dissection, suctioning, retraction, instrument exchange, assisting with delivery with administration of fundal pressure, and for overall help during the procedure.   Azreal Stthomas ConstantMD  10/03/2017 7:55 AM

## 2017-10-03 NOTE — MAU Provider Note (Signed)
Pt informed that the ultrasound is considered a limited OB ultrasound and is not intended to be a complete ultrasound exam.  Patient also informed that the ultrasound is not being completed with the intent of assessing for fetal or placental anomalies or any pelvic abnormalities.  Explained that the purpose of today's ultrasound is to assess for  presentation.  Patient acknowledges the purpose of the exam and the limitations of the study.    SROM with RN cx exam; copious amounts of clear fluid with vernix. Viable, active fetus found to be in transverse position with fetal head to maternal RT. Patient states she did not show up for her ECV on 4/16. Baby found to be vertex on U/S on 4/22. She has IOL scheduled for 10/06/2017.  RN to call Dr. Doroteo Glassman.  Raelyn Mora, CNM  10/03/2017 4:26 AM

## 2017-10-03 NOTE — Progress Notes (Signed)
I have reviewed this chart and agree with the RN/CMA assessment and management.    Hilmar Moldovan C Lus Kriegel, MD, FACOG Attending Physician, Faculty Practice [facility]  

## 2017-10-03 NOTE — Anesthesia Preprocedure Evaluation (Signed)
Anesthesia Evaluation  Patient identified by MRN, date of birth, ID band Patient awake    Reviewed: Allergy & Precautions, NPO status , Patient's Chart, lab work & pertinent test results  Airway Mallampati: II  TM Distance: >3 FB Neck ROM: Full    Dental no notable dental hx.    Pulmonary neg pulmonary ROS,    Pulmonary exam normal breath sounds clear to auscultation       Cardiovascular negative cardio ROS Normal cardiovascular exam Rhythm:Regular Rate:Normal     Neuro/Psych negative neurological ROS  negative psych ROS   GI/Hepatic negative GI ROS, Neg liver ROS,   Endo/Other  negative endocrine ROS  Renal/GU negative Renal ROS  negative genitourinary   Musculoskeletal negative musculoskeletal ROS (+)   Abdominal   Peds negative pediatric ROS (+)  Hematology negative hematology ROS (+)   Anesthesia Other Findings   Reproductive/Obstetrics negative OB ROS (+) Pregnancy                             Anesthesia Physical Anesthesia Plan  ASA: II and emergent  Anesthesia Plan: Spinal   Post-op Pain Management:    Induction:   PONV Risk Score and Plan: 2 and Ondansetron and Treatment may vary due to age or medical condition  Airway Management Planned: Natural Airway  Additional Equipment:   Intra-op Plan:   Post-operative Plan:   Informed Consent: I have reviewed the patients History and Physical, chart, labs and discussed the procedure including the risks, benefits and alternatives for the proposed anesthesia with the patient or authorized representative who has indicated his/her understanding and acceptance.   Dental advisory given  Plan Discussed with:   Anesthesia Plan Comments:         Anesthesia Quick Evaluation

## 2017-10-03 NOTE — Anesthesia Procedure Notes (Signed)
Spinal  Patient location during procedure: OR Staffing Anesthesiologist: Tajana Crotteau, MD Performed: anesthesiologist  Preanesthetic Checklist Completed: patient identified, site marked, surgical consent, pre-op evaluation, timeout performed, IV checked, risks and benefits discussed and monitors and equipment checked Spinal Block Patient position: sitting Prep: DuraPrep Patient monitoring: heart rate, continuous pulse ox and blood pressure Approach: right paramedian Location: L3-4 Injection technique: single-shot Needle Needle type: Sprotte  Needle gauge: 24 G Needle length: 9 cm Additional Notes Expiration date of kit checked and confirmed. Patient tolerated procedure well, without complications.       

## 2017-10-03 NOTE — Addendum Note (Signed)
Addendum  created 10/03/17 1515 by Orlie Pollen, CRNA   Sign clinical note

## 2017-10-04 ENCOUNTER — Other Ambulatory Visit: Payer: Medicaid Other

## 2017-10-04 ENCOUNTER — Encounter (HOSPITAL_COMMUNITY): Payer: Self-pay | Admitting: Obstetrics and Gynecology

## 2017-10-04 LAB — CBC
HCT: 18.6 % — ABNORMAL LOW (ref 36.0–46.0)
Hemoglobin: 6.1 g/dL — CL (ref 12.0–15.0)
MCH: 24.5 pg — ABNORMAL LOW (ref 26.0–34.0)
MCHC: 32.8 g/dL (ref 30.0–36.0)
MCV: 74.7 fL — ABNORMAL LOW (ref 78.0–100.0)
Platelets: 198 10*3/uL (ref 150–400)
RBC: 2.49 MIL/uL — ABNORMAL LOW (ref 3.87–5.11)
RDW: 16 % — ABNORMAL HIGH (ref 11.5–15.5)
WBC: 9.9 10*3/uL (ref 4.0–10.5)

## 2017-10-04 LAB — CBC WITH DIFFERENTIAL/PLATELET
Basophils Absolute: 0 10*3/uL (ref 0.0–0.1)
Basophils Relative: 0 %
Eosinophils Absolute: 0.1 10*3/uL (ref 0.0–0.7)
Eosinophils Relative: 1 %
HCT: 19 % — ABNORMAL LOW (ref 36.0–46.0)
Hemoglobin: 6.4 g/dL — CL (ref 12.0–15.0)
Lymphocytes Relative: 29 %
Lymphs Abs: 3.1 10*3/uL (ref 0.7–4.0)
MCH: 25.5 pg — ABNORMAL LOW (ref 26.0–34.0)
MCHC: 33.7 g/dL (ref 30.0–36.0)
MCV: 75.7 fL — ABNORMAL LOW (ref 78.0–100.0)
Monocytes Absolute: 0.4 10*3/uL (ref 0.1–1.0)
Monocytes Relative: 3 %
Neutro Abs: 7.2 10*3/uL (ref 1.7–7.7)
Neutrophils Relative %: 67 %
Platelets: 218 10*3/uL (ref 150–400)
RBC: 2.51 MIL/uL — ABNORMAL LOW (ref 3.87–5.11)
RDW: 16 % — ABNORMAL HIGH (ref 11.5–15.5)
WBC: 10.7 10*3/uL — ABNORMAL HIGH (ref 4.0–10.5)

## 2017-10-04 LAB — PREPARE RBC (CROSSMATCH)

## 2017-10-04 LAB — BIRTH TISSUE RECOVERY COLLECTION (PLACENTA DONATION)

## 2017-10-04 MED ORDER — FERROUS SULFATE 325 (65 FE) MG PO TABS
325.0000 mg | ORAL_TABLET | Freq: Two times a day (BID) | ORAL | Status: DC
  Administered 2017-10-04 – 2017-10-05 (×3): 325 mg via ORAL
  Filled 2017-10-04 (×3): qty 1

## 2017-10-04 MED ORDER — SODIUM CHLORIDE 0.9 % IV SOLN
Freq: Once | INTRAVENOUS | Status: AC
  Administered 2017-10-04: 22:00:00 via INTRAVENOUS

## 2017-10-04 MED ORDER — IBUPROFEN 600 MG PO TABS
600.0000 mg | ORAL_TABLET | Freq: Four times a day (QID) | ORAL | Status: DC | PRN
  Administered 2017-10-04 – 2017-10-05 (×3): 600 mg via ORAL
  Filled 2017-10-04 (×3): qty 1

## 2017-10-04 NOTE — Progress Notes (Signed)
POSTPARTUM PROGRESS NOTE  Post operative Day #1  Subjective:  Shaylie Eklund is a 32 y.o. [id number] [redacted]w[redacted]d s/p pLTCS complicated by T-incision in the setting of malpresentation.    Hemoglobin returned at 6.1 on morning labs from 10 pre-operatively. EBL for the surgery was 1740cc. She had some dizziness yesterday, but has not had any this morning with ambulation. She has remained hemodynamically stable.   No acute events overnight.  Pt denies problems with ambulating, voiding or po intake.  She denies nausea or vomiting.  Pain is well controlled.  She has had flatus. She has not had bowel movement.  Lochia Small.   Objective: Blood pressure (!) 103/49, pulse 71, temperature 98.4 F (36.9 C), temperature source Oral, resp. rate 18, height  (1.651 m), weight 86 kg (189 lb 9.5 oz), SpO2 100 %, unknown if currently breastfeeding.  Physical Exam:  General: alert, cooperative and no distress Lochia:normal flow Chest: CTAB Heart: RRR no m/r/g Abdomen: +BS, soft, nontender,  Uterine Fundus: firm, palpable approximately 1 finger-width below the umbilicus DVT Evaluation: No calf swelling or tenderness Extremities: no lower extremity edema  Recent Labs    10/03/17 1022 10/04/17 0602  HGB 8.0* 6.1*  HCT 24.2* 18.6*    Assessment/Plan:  ASSESSMENT: Levette Aune is a 32 y.o. [id number] [redacted]w[redacted]d s/p pLTCS complicated by T-incision in the setting of malpresentation.    Patient with significant decrease in hemoglobin. Asymptomatic and hemodynamically stable at this time. Patient hesitant for blood transfusion, but willing to accept if necessary. Will repeat hemoglobin around noon, and if any further decrease, will transfuse 2u of pRBCs.   Pain is well controlled otherwise Passing flatus Foley has been removed Honey comb dressing in place and appears c/d/i  Plan for discharge tomorrow or the next day pending stabilization of hemoglobin.    LOS: 1 day   Gorden Harms, MD PGY-3 10/04/2017,  9:03 AM

## 2017-10-04 NOTE — Progress Notes (Signed)
Patient expressing concerns with informed consent for blood transfusion.  C[place]ell, MD notified, will come and speak with patient regarding concerns.

## 2017-10-04 NOTE — Progress Notes (Signed)
This note also relates to the following rows which could not be included: Rate - Cannot attach notes to extension rows  Patient educated about risks and benefits of blood products

## 2017-10-04 NOTE — Progress Notes (Signed)
I have reviewed this chart and agree with the RN/CMA assessment and management.    Lakia Gritton C Floretta Petro, MD, FACOG Attending Physician, Faculty Practice [facility]  

## 2017-10-04 NOTE — Progress Notes (Addendum)
Pt states she is dizzy while in bed. Temp 98.4, BP 118/67, HR 69, RR 18. Pt states no pain. FF@U  scant lochia. LTCS honeycomb dressing is clean, dry and intact.  Pt stated sh ewas up to bathroom at 1100 AM and not dizzy. Pt has not had lunch, I encouraged her to order her lunch. Family states they do not need help ordering lunch.

## 2017-10-04 NOTE — Progress Notes (Signed)
Went to assess patient s/p repeat hemoglobin. She is doing well. No concerns. Discussed trend in hemoglobin. Patient consents to blood transfusion. Will transfuse 2U pRBCs. Continue iron.   Caryl Ada, DO OB Fellow Center for [facility], [facility]

## 2017-10-04 NOTE — Progress Notes (Signed)
CRITICAL VALUE ALERT  Critical Value:  HGB 6.1 Date & Time Notied:  10/04/17 1610  Provider Notified: 10/04/17 0713  Orders Received/Actions taken: No orders at this time; MD will update RN after morning rounds; RN will update MD if there are changes in patient condition. Will continue to monitor closely.

## 2017-10-05 ENCOUNTER — Encounter: Payer: Medicaid Other | Admitting: Obstetrics & Gynecology

## 2017-10-05 ENCOUNTER — Encounter: Payer: Medicaid Other | Admitting: Student

## 2017-10-05 ENCOUNTER — Other Ambulatory Visit: Payer: Medicaid Other

## 2017-10-05 LAB — CBC WITH DIFFERENTIAL/PLATELET
Basophils Absolute: 0 10*3/uL (ref 0.0–0.1)
Basophils Relative: 0 %
Eosinophils Absolute: 0.3 10*3/uL (ref 0.0–0.7)
Eosinophils Relative: 3 %
HCT: 24.6 % — ABNORMAL LOW (ref 36.0–46.0)
Hemoglobin: 8 g/dL — ABNORMAL LOW (ref 12.0–15.0)
Lymphocytes Relative: 21 %
Lymphs Abs: 2.5 10*3/uL (ref 0.7–4.0)
MCH: 24.9 pg — ABNORMAL LOW (ref 26.0–34.0)
MCHC: 32.5 g/dL (ref 30.0–36.0)
MCV: 76.6 fL — ABNORMAL LOW (ref 78.0–100.0)
Monocytes Absolute: 0.4 10*3/uL (ref 0.1–1.0)
Monocytes Relative: 3 %
Neutro Abs: 8.6 10*3/uL — ABNORMAL HIGH (ref 1.7–7.7)
Neutrophils Relative %: 73 %
Platelets: 213 10*3/uL (ref 150–400)
RBC: 3.21 MIL/uL — ABNORMAL LOW (ref 3.87–5.11)
RDW: 15.7 % — ABNORMAL HIGH (ref 11.5–15.5)
WBC: 11.8 10*3/uL — ABNORMAL HIGH (ref 4.0–10.5)

## 2017-10-05 LAB — BPAM RBC
Blood Product Expiration Date: 201905110041
Blood Product Expiration Date: 201905112359
ISSUE DATE / TIME: 201905022132
ISSUE DATE / TIME: 201905022355
Unit Type and Rh: 6200
Unit Type and Rh: 6200

## 2017-10-05 LAB — TYPE AND SCREEN
ABO/RH(D): AB POS
Antibody Screen: NEGATIVE
Unit division: 0
Unit division: 0

## 2017-10-05 MED ORDER — OXYCODONE HCL 5 MG PO TABS: 5.0000 mg | ORAL_TABLET | Freq: Four times a day (QID) | ORAL | 0 refills | Status: DC | PRN

## 2017-10-05 MED ORDER — FERROUS SULFATE 325 (65 FE) MG PO TABS: 325.0000 mg | ORAL_TABLET | Freq: Two times a day (BID) | ORAL | 0 refills | Status: DC

## 2017-10-05 MED ORDER — IBUPROFEN 600 MG PO TABS: 600.0000 mg | ORAL_TABLET | Freq: Four times a day (QID) | ORAL | 0 refills | Status: DC | PRN

## 2017-10-05 NOTE — Discharge Summary (Addendum)
OB Discharge Summary     Patient Name: Krystal Marshall DOB: Oct 13, 1985 MRN: [id number]  Date of admission: 10/03/2017 Delivering MD: Mora Bellman   Date of discharge: 10/05/2017  Admitting diagnosis: 1 WEEKS CTX  Intrauterine pregnancy: [redacted]w[redacted]d    Secondary diagnosis:  Active Problems:   Status post cesarean section   Status post primary low transverse cesarean section   Post term pregnancy  Additional problems:  Malpresentation C-section requiring T incision Postpartum hemorrhage with EBL 1700cc     Discharge diagnosis: Term Pregnancy Delivered, Anemia and PPH                                                                                                Post partum procedures:blood transfusion  Augmentation: none  Complications: [id number]>[id number] Hospital course:  Onset of Labor With Unplanned C/S  32y.o. yo G3P3003 at 416w5das admitted in [place] 10/03/2017. Patient had a labor course significant for breech presentation requiring c-section. Membrane Rupture Time/Date: 4:18 AM ,10/03/2017    The patient went for cesarean section due to Malpresentation, and delivered a Viable infant,10/03/2017   Details of operation can be found in separate operative note. In brief, the fetus was a back down breech presentation that was difficult and ultimately required the creation of a T incision. The patient subsequently had a significant drop in her hemoglobin from 10.3 to 6.1. She received 2u of pRBCs prior to discharge. Patient had an uncomplicated postpartum course otherwise.  She is ambulating,tolerating a regular diet, passing flatus, and urinating well.  Patient is discharged home in stable condition 10/05/17.  Physical exam  Vitals:   10/04/17 2345 10/05/17 0020 10/05/17 0216 10/05/17 0657  BP: 119/70 (!) 106/52 109/65 114/76  Pulse: 77 70 71 62  Resp: 20 18 18 18   Temp: 98.4 F (36.9 C) 98.3 F (36.8 C) 98.4 F (36.9 C) 98.1 F (36.7 C)  TempSrc: Oral Oral Oral Oral   SpO2: 100% 99% 97% 98%  Weight:      Height:       General: alert, cooperative and no distress Lochia: appropriate Uterine Fundus: firm Incision: Healing well with no significant drainage DVT Evaluation: No evidence of DVT seen on physical exam. Labs: Lab Results  Component Value Date   WBC 10.7 (H) 10/04/2017   HGB 6.4 (LL) 10/04/2017   HCT 19.0 (L) 10/04/2017   MCV 75.7 (L) 10/04/2017   PLT 218 10/04/2017   CMP Latest Ref Rng & Units 10/03/2017  Glucose 70 - 99 mg/dL -  BUN 6 - 23 mg/dL -  Creatinine 0.44 - 1.00 mg/dL 0.38(L)  Sodium 135 - 145 mmol/L -  Potassium 3.5 - 5.1 mmol/L -  Chloride 96 - 112 mmol/L -  CO2 19 - 32 mmol/L -  Calcium 8.4 - 10.5 mg/dL -    Discharge instruction: per After Visit Summary and "Baby and Me Booklet".  After visit meds:  Allergies as of 10/05/2017   No Known Allergies     Medication List    TAKE these medications   ferrous sulfate 325 (65 FE) MG  tablet Take 1 tablet (325 mg total) by mouth 2 (two) times daily with a meal.   ibuprofen 600 MG tablet Commonly known as:  ADVIL,MOTRIN Take 1 tablet (600 mg total) by mouth every 6 (six) hours as needed for mild pain or moderate pain.   oxyCODONE 5 MG immediate release tablet Commonly known as:  Oxy IR/ROXICODONE Take 1 tablet (5 mg total) by mouth every 6 (six) hours as needed.   Prenatal Vitamins 0.8 MG tablet Take 1 tablet by mouth daily.       Diet: routine diet  Activity: Advance as tolerated. Pelvic rest for 6 weeks.   Outpatient follow up:10 days for incision check and 1 month for routine postpartum care Follow up Appt: Future Appointments  Date Time Provider [place]  10/17/2017  9:15 AM Chester Vallonia  11/12/2017  4:00 PM Katheren Shams, DO [facility]   Follow up Visit:No follow-ups on file.  Postpartum contraception: Nexplanon  Newborn Data: Live born female  Birth Weight: 6 lb 14.9 oz (3145 g) APGAR: 4, 8  Newborn Delivery   Birth  date/time:  10/03/2017 07:21:00 Delivery type:  C-Section, Low Vertical Trial of labor:  No C-section categorization:  Primary     Baby Feeding: Breast Disposition:home with mother   10/05/2017 Crissie Sickles, MD   I have spoken with and examined this patient and agree with resident/PA-S/Med-S/SNM's note and plan of care. VSS, HRR&R, Resp unlabored, Legs neg.  Nigel Berthold, CNM 10/09/2017 11:37 AM

## 2017-10-05 NOTE — Discharge Instructions (Signed)

## 2017-10-06 ENCOUNTER — Inpatient Hospital Stay (HOSPITAL_COMMUNITY): Admission: RE | Admit: 2017-10-06 | Payer: Medicaid Other | Source: Ambulatory Visit

## 2017-10-08 ENCOUNTER — Other Ambulatory Visit: Payer: Medicaid Other

## 2017-10-10 ENCOUNTER — Encounter: Payer: Self-pay | Admitting: *Deleted

## 2017-10-17 ENCOUNTER — Ambulatory Visit (INDEPENDENT_AMBULATORY_CARE_PROVIDER_SITE_OTHER): Payer: Medicaid Other | Admitting: *Deleted

## 2017-10-17 VITALS — BP 116/67 | HR 70 | Ht 65.0 in | Wt 168.4 lb

## 2017-10-17 DIAGNOSIS — Z4889 Encounter for other specified surgical aftercare: Secondary | ICD-10-CM

## 2017-10-17 NOTE — Progress Notes (Signed)
I have reviewed this chart and agree with the RN/CMA assessment and management.    Dearies Meikle C Bambie Pizzolato, MD, FACOG Attending Physician, [facility]  

## 2017-10-17 NOTE — Progress Notes (Signed)
Pt presented for incision check - C/S performed on 10/03/17. Pt declined use of video Arabic interpreter and entire visit was completed in [place]. Pt's husband also present for encounter. She denies pain and has not been taking pain meds for several days.  Incision assessed and found to be well-healed with skin edges well approximated. No redness, swelling or drainage from the incision. Proper cleansing of affected area reviewed. Medication reconciliation completed. Pt has not been taking Ferrous sulfate or PNV. Due to history low Hgb/Hct after delivery and subsequent blood transfusion, pt was advised to resume taking both Ferrous sulfate and PNV as previously prescribed. She may take Colace if needed for constipation. Pt has PP appt scheduled on 6/10 and desires Nexplanon for birth control. Pt and husband advised of no sexual intercourse until after PP visit. She voiced understanding of all information and instructions given.

## 2017-11-12 ENCOUNTER — Ambulatory Visit (INDEPENDENT_AMBULATORY_CARE_PROVIDER_SITE_OTHER): Payer: Medicaid Other

## 2017-11-12 DIAGNOSIS — Z3046 Encounter for surveillance of implantable subdermal contraceptive: Secondary | ICD-10-CM | POA: Diagnosis not present

## 2017-11-12 DIAGNOSIS — Z1389 Encounter for screening for other disorder: Secondary | ICD-10-CM | POA: Diagnosis not present

## 2017-11-12 DIAGNOSIS — Z30017 Encounter for initial prescription of implantable subdermal contraceptive: Secondary | ICD-10-CM

## 2017-11-12 MED ORDER — ETONOGESTREL 68 MG ~~LOC~~ IMPL
68.0000 mg | DRUG_IMPLANT | Freq: Once | SUBCUTANEOUS | Status: AC
  Administered 2017-11-12: 68 mg via SUBCUTANEOUS

## 2017-11-12 NOTE — Patient Instructions (Signed)
Nexplanon Instructions After Insertion  Keep bandage clean and dry for 24 hours  May use ice/Tylenol/Ibuprofen for soreness or pain  If you develop fever, drainage or increased warmth from incision site-contact office immediately   

## 2017-11-12 NOTE — Progress Notes (Signed)
Subjective:     Krystal Marshall is a 32 y.o. female who presents for a postpartum visit. She is 5 weeks 5 days postpartum following a low transverse c/s with t extension. I have fully reviewed the prenatal and intrapartum course. The delivery was at 40.5 gestational weeks. Outcome: primary cesarean section, low transverse incision. Anesthesia: spinal. Postpartum course complicated with pp hemorrhage and had transfusion with 2 units blood. Baby's course has been uncomplicated. Baby is feeding by both breast and bottle - Similac Sensitive RS. Bleeding no bleeding. Bowel function is normal. Bladder function is normal. Patient is not sexually active. Contraception method is undecided and wants to discuss. Postpartum depression screening: negative.  The following portions of the patient's history were reviewed and updated as appropriate: allergies, current medications, past family history, past medical history, past social history, past surgical history and problem list.  Review of Systems Negative   Objective:    BP 130/79   Pulse 67   Wt 171 lb 6.4 oz (77.7 kg)   BMI 28.52 kg/m   General:  alert, cooperative and no distress   Breasts:  inspection negative, no nipple discharge or bleeding, no masses or nodularity palpable  Lungs: clear to auscultation bilaterally  Heart:  regular rate and rhythm, S1, S2 normal, no murmur, click, rub or gallop  Abdomen: normal findings: bowel sounds normal   Vulva:  not evaluated  Vagina: not evaluated  Cervix:  Not evaluated  Corpus: not examined  Adnexa:  not evaluated  Rectal Exam: Not performed.        Assessment:    Normal postpartum exam. Pap smear not done at today's visit.   Nexplanon Insertion Procedure Patient identified, informed consent performed, consent signed.   Patient does understand that irregular bleeding is a very common side effect of this medication. She was advised to have backup contraception for one week after placement. Pregnancy  test in clinic today was negative.  Appropriate time out taken.  Patient's left arm was prepped and draped in the usual sterile fashion. The ruler used to measure and mark insertion area.  Patient was prepped with alcohol swab and then injected with 3 ml of 1% lidocaine.  She was prepped with betadine, Nexplanon removed from packaging,  Device confirmed in needle, then inserted full length of needle and withdrawn per handbook instructions. Nexplanon was able to palpated in the patient's arm; patient palpated the insert herself. There was minimal blood loss.  Patient insertion site covered with guaze and a pressure bandage to reduce any bruising.  The patient tolerated the procedure well and was given post procedure instructions.    Plan:    1. Contraception: Nexplanon 2. Follow up in: 1 year or as needed.   Rolm BookbinderCaroline M Jahki Witham, CNM 11/12/17 5:22 PM

## 2018-01-17 ENCOUNTER — Ambulatory Visit: Payer: Self-pay

## 2018-01-17 NOTE — Lactation Note (Signed)
This note was copied from a baby's chart. Spoke with family with assistance of [employer] Arabic Interpreter Tag[employer] # [phone].   Infant presents today with mom and dad as mom is concerned with white bump on her left nipple that has been present for 15 days. Mom denies pain at any time. Mom denies fever or redness to the breast. Mom reports she has not had any lumps in the breasts at all during this time. Mom denies history of plugged ducts with any of her children.  Mom is concerned with cancer.   Infant has gained 3409 grams in the last 104 days since leaving hospital with an average daily weight gain of 33 grams a day.   Mom reports infant feeds every 2 hours using both breasts with each feeding. Mom reports her nipple is longer after feeding with no compression. Infant ate just before coming to the visit. She reports infant is voiding and stooling well.   Mom is noted to have a large milk bleb to the left nipple at about 4 o'clock. that has been there for 15 days. Warm moist compress was applied for 10 minutes, the bleb seemed to retract some after compress removed. Mom then latched infant to the left breast to feed. Infant latched well and fed for about 10 minutes. She has had a cough and did cough several times during feeding.  Nipple was rounded post feeding.  Bleb was more pronounced post feeding.  We tried to gently rub the area with the moist cloth and area noted to be thick and hard. Mom hand expressed with no relief to the bleb. Mom did note pain when area was rubbed or squeezed.    Enc mom to schedule appt with OB and in the mean time to continue warm compresses, breast feeding and hand expression.   AVS was read and interpreted by interpreter while on the phone. Mom reports no questions/concerns at this time. Mom to call with any questions/concerns as needed.   [facility] For follow up with a provider to see if area needs to  be lanced. She does not appear to have any infection at this time. Mom to call OB with any flu like symptoms or fever.   Plan is for mom to come to walk [facility] hours [facility]  on Monday, Tuesday or Wednesday next week. Parents concerned mom does not have insurance as her Medicaid is expired. Office staff gave her application to complete and return.       Silas FloodSharon S Hice 01/17/2018, 8:40 AM

## 2018-07-12 IMAGING — US US MFM OB FOLLOW-UP
1 series · 14 of 28 positions shown · non-contrast
Comparison: none

[Series 1: us mfm ob follow-up · 54 acquisitions, 14 frames shown]
[im 2/54]
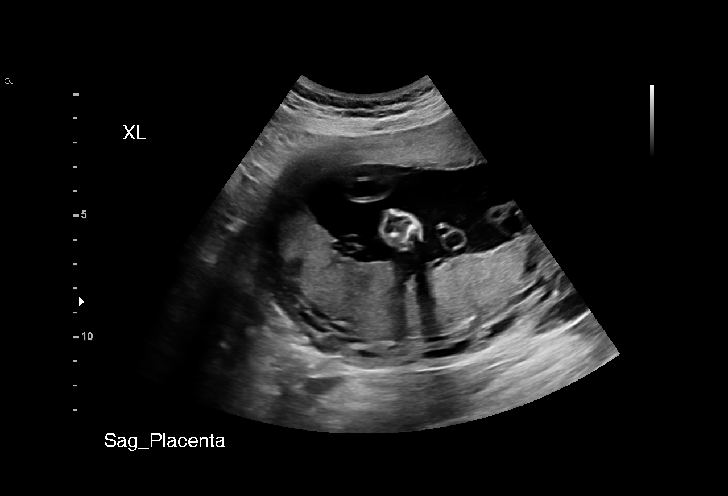
[im 6/54]
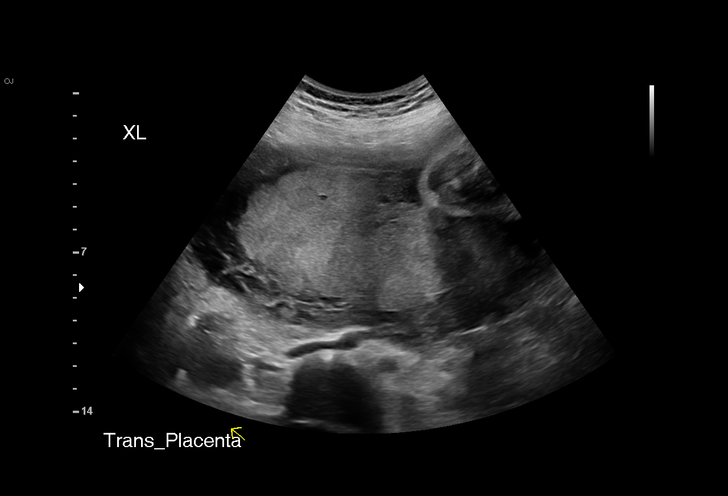
[im 10/54]
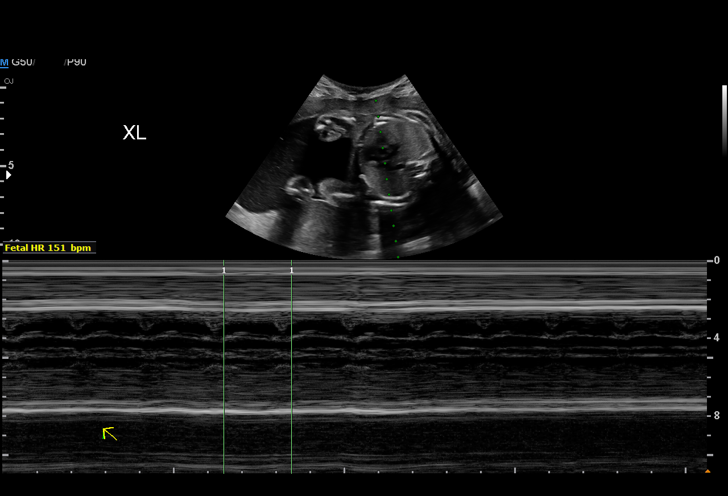
[im 14/54]
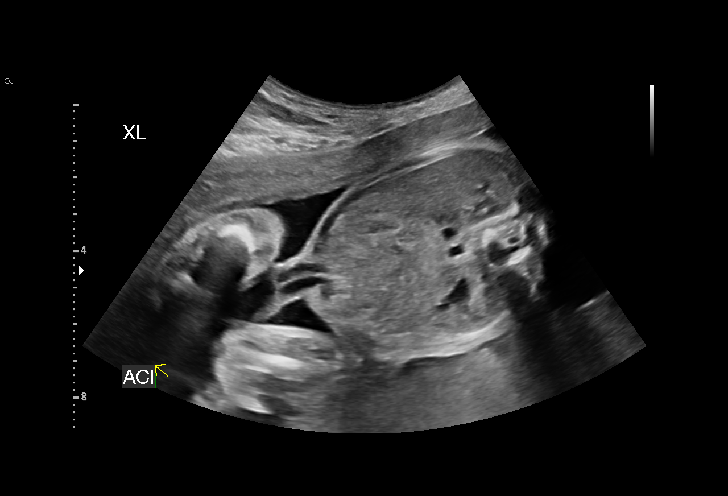
[im 18/54]
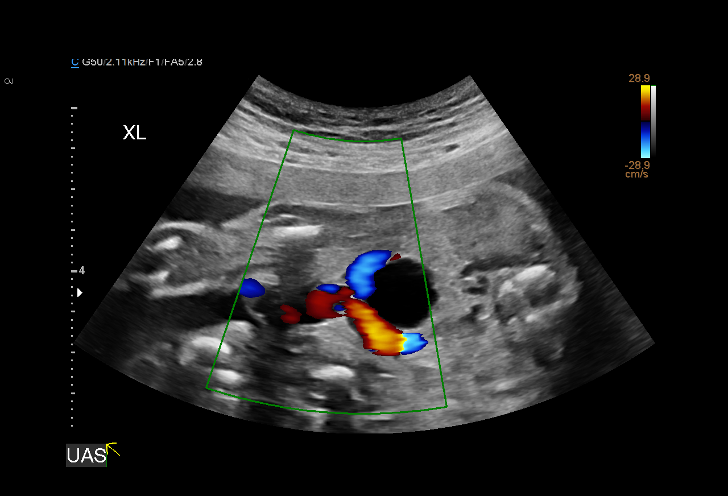
[im 22/54]
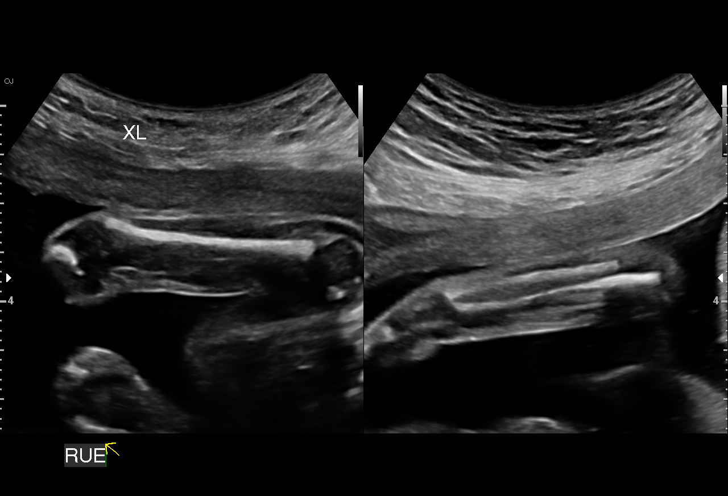
[im 26/54]
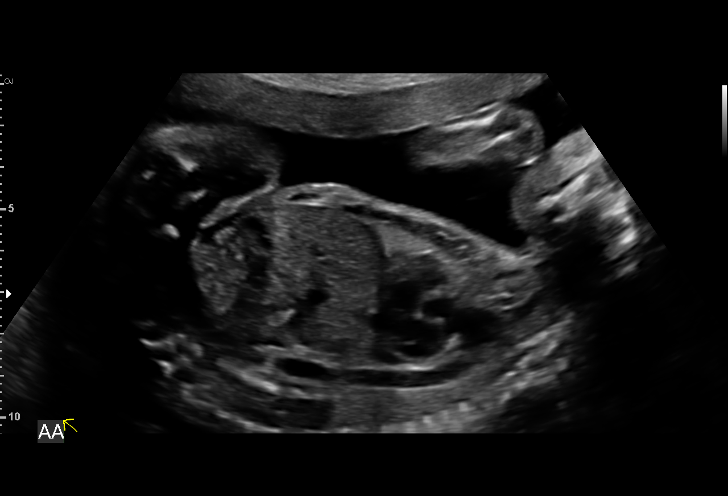
[im 30/54]
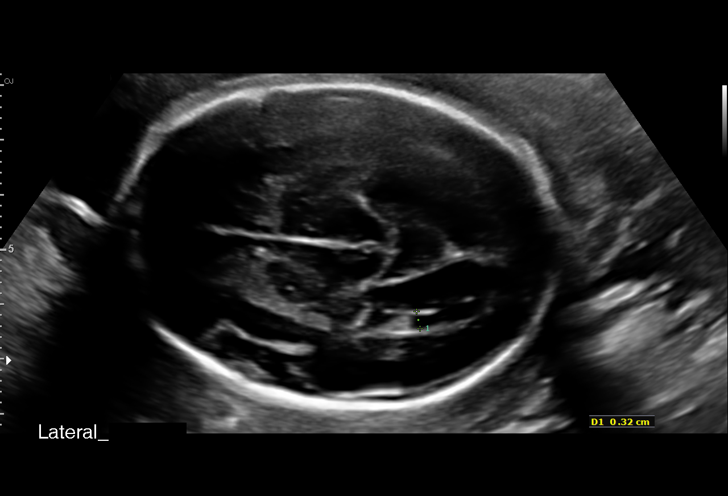
[im 34/54]
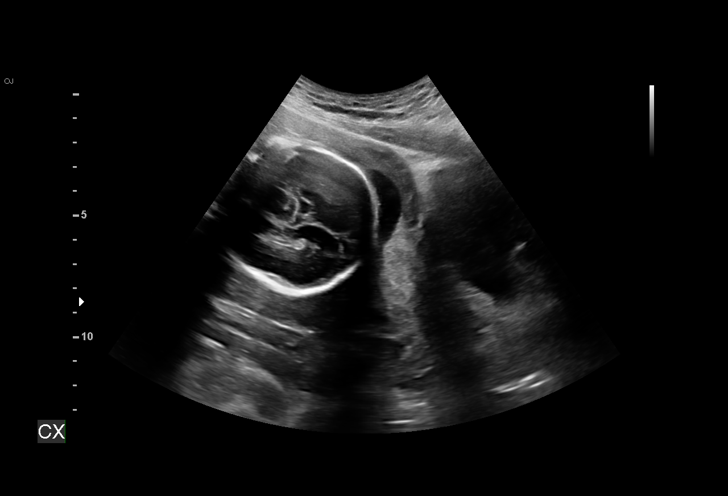
[im 38/54]
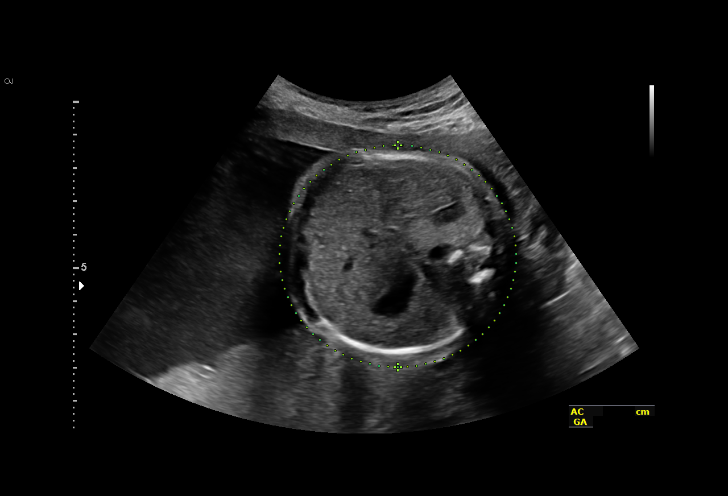
[im 42/54]
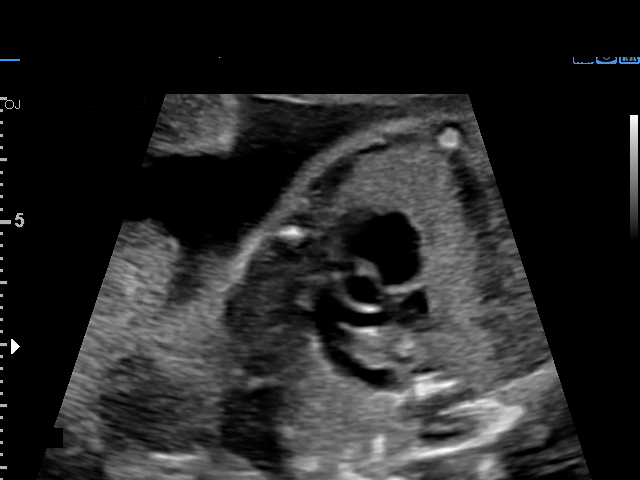
[im 46/54]
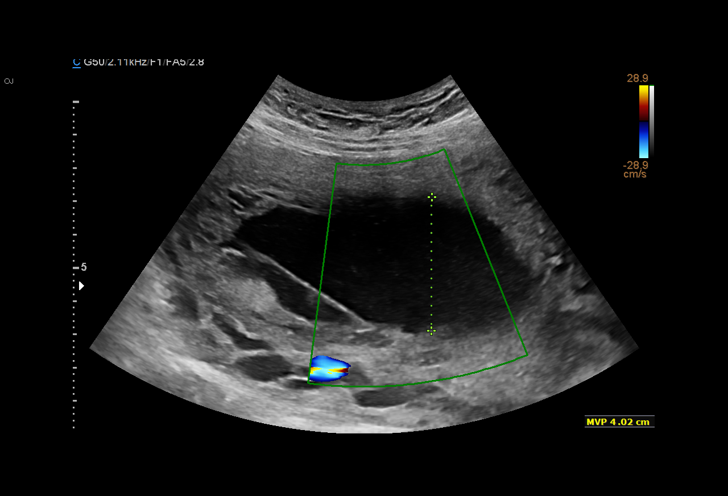
[im 50/54]
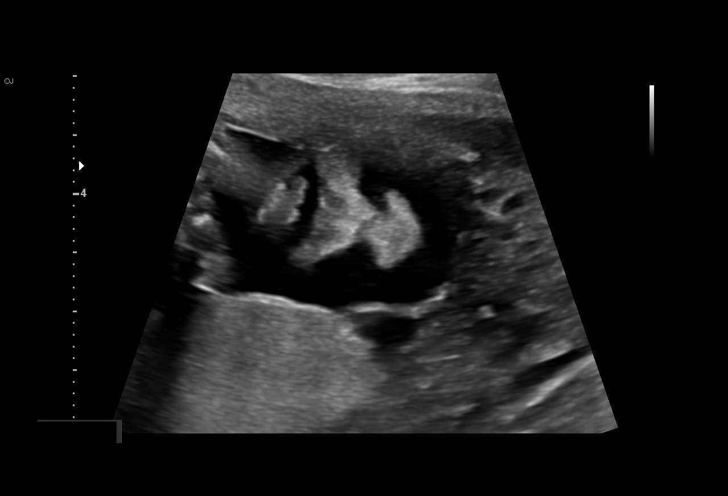
[im 54/54]
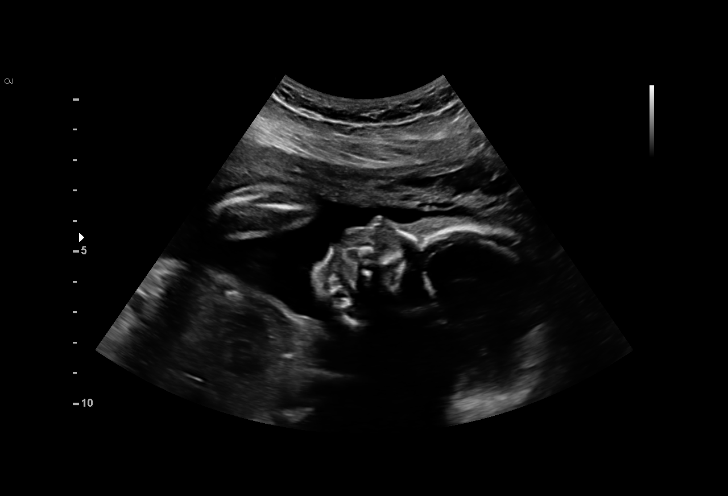

[14 of 28 positions shown; findings below may reference images not displayed]

OB/Gyn Clinic

1  LEAMSER TUIRY           [id number]      [id number]     [id number]
Indications

25 weeks gestation of pregnancy
Placenta previa specified as without
hemorrhage, second trimester
OB History

Gravidity:    3         Term:   2        Prem:   0        SAB:   0
TOP:          0       Ectopic:  0        Living: 2
Fetal Evaluation

Num Of Fetuses:     1
Fetal Heart         151
Rate(bpm):
Cardiac Activity:   Observed
Presentation:       Cephalic
Placenta:           Posterior, above cervical os

Amniotic Fluid
AFI FV:      Subjectively within normal limits

Largest Pocket(cm)
4.02
Biometry

BPD:        58  mm     G. Age:  23w 6d          3  %    CI:        70.68   %    70 - 86
FL/HC:      21.7   %    18.7 -
HC:      219.9  mm     G. Age:  24w 0d        < 3  %    HC/AC:      1.02        1.04 -
AC:      216.5  mm     G. Age:  26w 1d         63  %    FL/BPD:     82.2   %    71 - 87
FL:       47.7  mm     G. Age:  26w 0d         53  %    FL/AC:      22.0   %    20 - 24
HUM:      44.3  mm     G. Age:  26w 2d         66  %

Est. FW:     844  gm    1 lb 14 oz      59  %
Gestational Age

U/S Today:     25w 0d                                        EDD:   10/01/17
Best:          25w 3d     Det. By:  U/S C R L  (03/19/17)    EDD:   09/28/17
Anatomy

Cranium:               Appears normal         Aortic Arch:            Appears normal
Cavum:                 Appears normal         Ductal Arch:            Previously seen
Ventricles:            Appears normal         Diaphragm:              Appears normal
Choroid Plexus:        Previously seen        Stomach:                Appears normal, left
sided
Cerebellum:            Appears normal         Abdomen:                Appears normal
Posterior Fossa:       Appears normal         Abdominal Wall:         Appears nml (cord
insert, abd wall)
Nuchal Fold:           Previously seen        Cord Vessels:           Appears normal (3
vessel cord)
Face:                  Orbits and profile     Kidneys:                Appear normal
previously seen
Lips:                  Appears normal         Bladder:                Appears normal
Thoracic:              Appears normal         Spine:                  Previously seen
Heart:                 Appears normal         Upper Extremities:      Appears normal
(4CH, axis, and
situs)
RVOT:                  Appears normal         Lower Extremities:      Appears normal
LVOT:                  Appears normal

Other:  Fetus appears to be a female. Heels and  Nasal bone prev.
visualized.
Cervix Uterus Adnexa

Cervix
Length:           4.62  cm.
Normal appearance by transabdominal scan.

Uterus
No abnormality visualized.

Left Ovary
Size(cm)        3   x   2.1    x  1.5       Vol(ml):
Within normal limits.

Right Ovary
Size(cm)       2.6  x   1.6    x  1.6       Vol(ml):
Within normal limits.

Cul De Sac:   No free fluid seen.

Adnexa:       No abnormality visualized.
Impression

SIUP at 25+3 weeks
Normal fetal cardiac activity and fetal movement
Normal interval review of fetal anatomy
Normal amniotic fluid volume
Measurements show growth in the 59th percentile
Views of placenta show resolved previa
Recommendations

Follow-up ultrasounds as clinically indicated.

## 2018-08-24 ENCOUNTER — Encounter (HOSPITAL_COMMUNITY): Payer: Self-pay

## 2018-08-24 ENCOUNTER — Other Ambulatory Visit: Payer: Self-pay

## 2018-08-24 ENCOUNTER — Ambulatory Visit (HOSPITAL_COMMUNITY)
Admission: EM | Admit: 2018-08-24 | Discharge: 2018-08-24 | Disposition: A | Discharge status: Home / Self Care | Payer: Medicaid Other | Attending: Family Medicine | Admitting: Family Medicine

## 2018-08-24 DIAGNOSIS — H1033 Unspecified acute conjunctivitis, bilateral: Secondary | ICD-10-CM

## 2018-08-24 MED ORDER — POLYMYXIN B-TRIMETHOPRIM 10000-0.1 UNIT/ML-% OP SOLN: 1.0000 [drp] | OPHTHALMIC | 0 refills | Status: AC

## 2018-08-24 MED ORDER — CETIRIZINE HCL 10 MG PO TABS: 10.0000 mg | ORAL_TABLET | Freq: Every day | ORAL | 0 refills | Status: DC

## 2018-08-24 NOTE — Discharge Instructions (Signed)
Please complete course of eye drops, avoid touching eyes as able.  Please also start daily allergy medication as this may be helpful.  Follow up with eye doctor for any persistent or worsening of symptoms.  Return to be seen if any pain fever or vision changes

## 2018-08-24 NOTE — ED Provider Notes (Signed)
[facility]    CSN: [id number] Arrival date & time: 08/24/18  1231     History   Chief Complaint Chief Complaint  Patient presents with  . Eye Problem    HPI Cove Igleheart is a 33 y.o. female.   Melenda presents with complaints of bilateral eyes with redness, swelling, itching and burning which started last night. States started two hours after being at [employer]. Her children do not have, no other known ill contacts. Mild cough no nasal drainage. No fevers. No vision changes. Denies any previous. Has had tearing of the eyes. Started as right eye last night and today left eye developed as well. Some light sensitivity. She wears glasses, no contacts. Without contributing medical history. No known exposure or chemicals to eyes.  Hasn't taken any medications for symptoms.    ROS per HPI, negative if not otherwise mentioned.      Past Medical History:  Diagnosis Date  . Medical history non-contributory     Patient Active Problem List   Diagnosis Date Noted  . Status post cesarean section 10/03/2017  . Status post primary low transverse cesarean section 10/03/2017  . Post term pregnancy 10/03/2017  . Breech presentation, no version 09/24/2017  . Supervision of other normal pregnancy, antepartum 03/19/2017  . History of shoulder dystocia in prior pregnancy, currently pregnant 03/19/2017  . Language barrier affecting health care 04/22/2015    Past Surgical History:  Procedure Laterality Date  . CESAREAN SECTION N/A 10/03/2017   Procedure: CESAREAN SECTION;  Surgeon: Catalina Antigua, MD;  [facility];  Service: Obstetrics;  Laterality: N/A;  . NO PAST SURGERIES      OB History    Gravida  3   Para  3   Term  3   Preterm  0   AB  0   Living  3     SAB  0   TAB  0   Ectopic  0   Multiple  0   Live Births  3            Home Medications    Prior to Admission medications   Medication Sig Start Date End Date Taking?  Authorizing Provider  cetirizine (ZYRTEC) 10 MG tablet Take 1 tablet (10 mg total) by mouth daily. 08/24/18   Georgetta Haber, NP  trimethoprim-polymyxin b (POLYTRIM) ophthalmic solution Place 1 drop into both eyes every 4 (four) hours for 5 days. 08/24/18 08/29/18  Georgetta Haber, NP    Family History Family History  Problem Relation Age of Onset  . Cancer Neg Hx   . Diabetes Neg Hx   . Hypertension Neg Hx     Social History Social History   Tobacco Use  . Smoking status: Never Smoker  . Smokeless tobacco: Never Used  Substance Use Topics  . Alcohol use: No  . Drug use: No     Allergies   Patient has no known allergies.   Review of Systems Review of Systems   Physical Exam Triage Vital Signs ED Triage Vitals  Enc Vitals Group     BP 08/24/18 1308 119/76     Pulse Rate 08/24/18 1308 70     Resp 08/24/18 1308 16     Temp 08/24/18 1308 98.7 F (37.1 C)     Temp Source 08/24/18 1308 Oral     SpO2 08/24/18 1308 98 %     Weight --      Height --  Head Circumference --      Peak Flow --      Pain Score 08/24/18 1310 7     Pain Loc --      Pain Edu? --      Excl. in GC? --    No data found.  Updated Vital Signs BP 119/76 (BP Location: Right Arm)   Pulse 70   Temp 98.7 F (37.1 C) (Oral)   Resp 16   SpO2 98%   Visual Acuity Right Eye Distance: 20/25 Left Eye Distance: 20/25 Bilateral Distance: 20/25 with correction  Right Eye Near:   Left Eye Near:    Bilateral Near:     Physical Exam Constitutional:      General: She is not in acute distress.    Appearance: She is well-developed.  HENT:     Head: Normocephalic and atraumatic.     Right Ear: Tympanic membrane, ear canal and external ear normal.     Left Ear: Tympanic membrane, ear canal and external ear normal.     Nose: Nose normal.     Mouth/Throat:     Pharynx: Uvula midline.     Tonsils: No tonsillar exudate.  Eyes:     General:        Right eye: Discharge present.        Left  eye: Discharge present.    Extraocular Movements: Extraocular movements intact.     Conjunctiva/sclera:     Right eye: Right conjunctiva is injected.     Left eye: Left conjunctiva is injected.     Pupils: Pupils are equal, round, and reactive to light.     Comments: Bilateral eyes with mild swelling to upper and lower lids at lash line, no redness, clear tearing noted; no pain with ROM  Cardiovascular:     Rate and Rhythm: Normal rate and regular rhythm.     Heart sounds: Normal heart sounds.  Pulmonary:     Effort: Pulmonary effort is normal.     Breath sounds: Normal breath sounds.     Comments: Occasional congested cough noted  Skin:    General: Skin is warm and dry.  Neurological:     Mental Status: She is alert and oriented to person, place, and time.      UC Treatments / Results  Labs (all labs ordered are listed, but only abnormal results are displayed) Labs Reviewed - No data to display  EKG None  Radiology No results found.  Procedures Procedures (including critical care time)  Medications Ordered in UC Medications - No data to display  Initial Impression / Assessment and Plan / UC Course  I have reviewed the triage vital signs and the nursing notes.  Pertinent labs & imaging results that were available during my care of the patient were reviewed by me and considered in my medical decision making (see chart for details).     Three young children, will cover with antibiotics but also question allergic component therefore zyrtec initiated as well. No red flag findings. Follow up with eye doctor prn. Return precautions provided. Patient verbalized understanding and agreeable to plan.    Final Clinical Impressions(s) / UC Diagnoses   Final diagnoses:  Acute conjunctivitis of both eyes, unspecified acute conjunctivitis type     Discharge Instructions     Please complete course of eye drops, avoid touching eyes as able.  Please also start daily allergy  medication as this may be helpful.  Follow up with eye doctor for any  persistent or worsening of symptoms.  Return to be seen if any pain fever or vision changes   ED Prescriptions    Medication Sig Dispense Auth. Provider   trimethoprim-polymyxin b (POLYTRIM) ophthalmic solution Place 1 drop into both eyes every 4 (four) hours for 5 days. 10 mL Linus Mako B, NP   cetirizine (ZYRTEC) 10 MG tablet Take 1 tablet (10 mg total) by mouth daily. 30 tablet Georgetta Haber, NP     Controlled Substance Prescriptions [place] Controlled Substance Registry consulted? Not Applicable   Georgetta Haber, NP 08/24/18 1353

## 2018-08-24 NOTE — ED Triage Notes (Signed)
Pt presents today with with bilateral eye redness, swelling, and irritation. States it started yesterday after leaving [employer]. Started with the right eye then moved to left last night. No blurred vision noted.

## 2018-09-06 ENCOUNTER — Other Ambulatory Visit: Payer: Self-pay

## 2018-09-06 ENCOUNTER — Emergency Department (HOSPITAL_COMMUNITY)
Admission: EM | Admit: 2018-09-06 | Discharge: 2018-09-06 | Disposition: A | Discharge status: Home / Self Care | Payer: Medicaid Other | Attending: Emergency Medicine | Admitting: Emergency Medicine

## 2018-09-06 ENCOUNTER — Encounter (HOSPITAL_COMMUNITY): Payer: Self-pay | Admitting: Emergency Medicine

## 2018-09-06 DIAGNOSIS — H1013 Acute atopic conjunctivitis, bilateral: Secondary | ICD-10-CM | POA: Diagnosis not present

## 2018-09-06 DIAGNOSIS — L298 Other pruritus: Secondary | ICD-10-CM | POA: Diagnosis present

## 2018-09-06 MED ORDER — NAPHAZOLINE-PHENIRAMINE 0.025-0.3 % OP SOLN: 1.0000 [drp] | OPHTHALMIC | 0 refills | Status: DC | PRN

## 2018-09-06 MED ORDER — FLUTICASONE PROPIONATE 50 MCG/ACT NA SUSP: 1.0000 | Freq: Every day | NASAL | 2 refills | Status: DC

## 2018-09-06 NOTE — ED Triage Notes (Signed)
Reports bilateral eye erythema and pruritis that began today.

## 2018-09-06 NOTE — ED Provider Notes (Signed)
[facility] EMERGENCY DEPARTMENT Provider Note   CSN: [id number] Arrival date & time: 09/06/18  2020    History   Chief Complaint Chief Complaint  Patient presents with  . Eye Problem    HPI Krystal Marshall is a 33 y.o. female.     The history is provided by the patient and medical records. No language interpreter was used.  Eye Problem  Associated symptoms: itching and redness      33 year old female presenting complaining of eye irritation. Patient was last seen at urgent care 10 days ago for bilateral eye irritation.  At that time, patient was discharged home with antibiotic eyedrops as well as Zyrtec.  Patient states she took the medication and it did provide relief.  She was symptom-free for about 5 days.  Today she noticed bilateral itchy eyes and redness.  She does not complain of any headache, congestion, sneezing or coughing.  She denies any eye injury.  She does not wear any contact lens.  She has never had seasonal allergies before.  She reports concerning for possible coronavirus infection.  She does not complain of any loss of taste or smell any fever chills or productive cough or any shortness of breath.  No other environmental changes.  Past Medical History:  Diagnosis Date  . Medical history non-contributory     Patient Active Problem List   Diagnosis Date Noted  . Status post cesarean section 10/03/2017  . Status post primary low transverse cesarean section 10/03/2017  . Post term pregnancy 10/03/2017  . Breech presentation, no version 09/24/2017  . Supervision of other normal pregnancy, antepartum 03/19/2017  . History of shoulder dystocia in prior pregnancy, currently pregnant 03/19/2017  . Language barrier affecting health care 04/22/2015    Past Surgical History:  Procedure Laterality Date  . CESAREAN SECTION N/A 10/03/2017   Procedure: CESAREAN SECTION;  Surgeon: Catalina Antigua, MD;  [facility];  Service: Obstetrics;   Laterality: N/A;  . NO PAST SURGERIES       OB History    Gravida  3   Para  3   Term  3   Preterm  0   AB  0   Living  3     SAB  0   TAB  0   Ectopic  0   Multiple  0   Live Births  3            Home Medications    Prior to Admission medications   Medication Sig Start Date End Date Taking? Authorizing Provider  cetirizine (ZYRTEC) 10 MG tablet Take 1 tablet (10 mg total) by mouth daily. 08/24/18   Georgetta Haber, NP    Family History Family History  Problem Relation Age of Onset  . Cancer Neg Hx   . Diabetes Neg Hx   . Hypertension Neg Hx     Social History Social History   Tobacco Use  . Smoking status: Never Smoker  . Smokeless tobacco: Never Used  Substance Use Topics  . Alcohol use: No  . Drug use: No     Allergies   Patient has no known allergies.   Review of Systems Review of Systems  Constitutional: Negative for fever.  HENT: Negative for dental problem and postnasal drip.   Eyes: Positive for redness and itching.  Respiratory: Negative.      Physical Exam Updated Vital Signs Ht 5\' 5"  (1.651 m)   Wt 70 kg   BMI 25.68  kg/m   Physical Exam Vitals signs and nursing note reviewed.  Constitutional:      General: She is not in acute distress.    Appearance: She is well-developed.  HENT:     Head: Atraumatic.  Eyes:     General: Lids are normal. Lids are everted, no foreign bodies appreciated. Allergic shiner present.        Right eye: No foreign body, discharge or hordeolum.        Left eye: No foreign body, discharge or hordeolum.     Extraocular Movements: Extraocular movements intact.     Conjunctiva/sclera:     Right eye: Right conjunctiva is injected. No chemosis, exudate or hemorrhage.    Left eye: Left conjunctiva is injected. No chemosis, exudate or hemorrhage.    Pupils: Pupils are equal, round, and reactive to light.  Neck:     Musculoskeletal: Neck supple.  Skin:    Findings: No rash.  Neurological:      Mental Status: She is alert.      ED Treatments / Results  Labs (all labs ordered are listed, but only abnormal results are displayed) Labs Reviewed - No data to display  EKG None  Radiology No results found.  Procedures Procedures (including critical care time)  Medications Ordered in ED Medications - No data to display   Initial Impression / Assessment and Plan / ED Course  I have reviewed the triage vital signs and the nursing notes.  Pertinent labs & imaging results that were available during my care of the patient were reviewed by me and considered in my medical decision making (see chart for details).        BP 116/77 (BP Location: Right Arm)   Pulse 73   Temp 98.9 F (37.2 C) (Oral)   Resp 16   Ht 5\' 5"  (1.651 m)   Wt 70 kg   SpO2 99%   BMI 25.68 kg/m    Final Clinical Impressions(s) / ED Diagnoses   Final diagnoses:  Allergic conjunctivitis of both eyes    ED Discharge Orders         Ordered    fluticasone (FLONASE) 50 MCG/ACT nasal spray  Daily     09/06/18 2117    naphazoline-pheniramine (NAPHCON-A) 0.025-0.3 % ophthalmic solution  Every 4 hours PRN     09/06/18 2117         Patient here with bilateral eye redness and itchy eyes.  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  The symptom is suggestive of allergic conjunctivitis.  Low suspicion for ocular emergency.  Will prescribe Flonase for her allergies.  She may continue taking Zyrtec.  She was previously prescribed Polytrim for potential bacterial eye infection which I have low suspicion of.   Fayrene Helper, PA-C 09/06/18 2125    Melene Plan, DO 09/06/18 2222

## 2018-10-25 IMAGING — US US FETAL BPP W/ NON-STRESS
1 series · 13 of 13 positions shown · non-contrast
Comparison: none

[Series 1: us fetal bpp w/nonstress · 13 acquisitions, 13 frames shown]
[im 1/13]
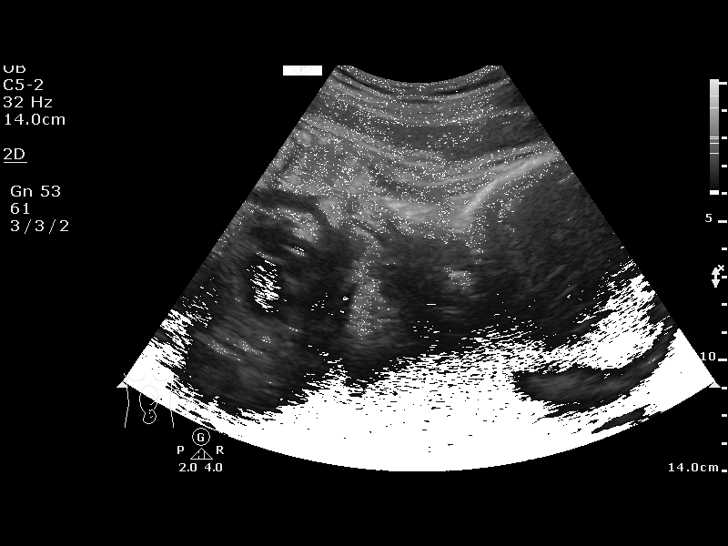
[im 2/13]
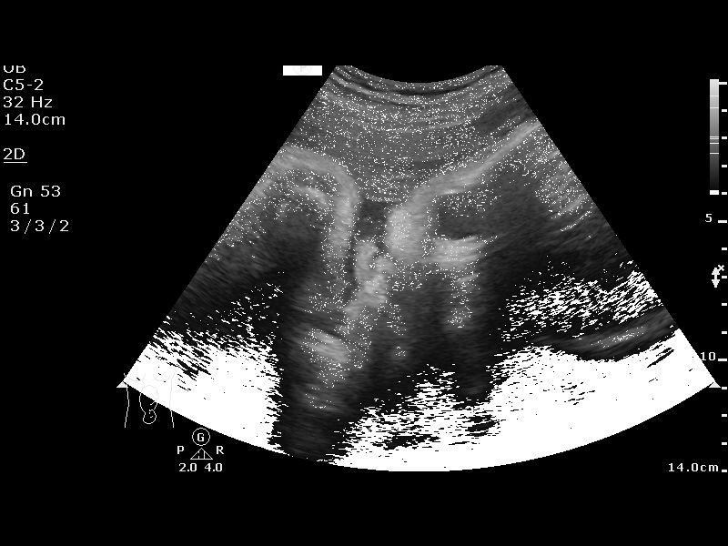
[im 3/13]
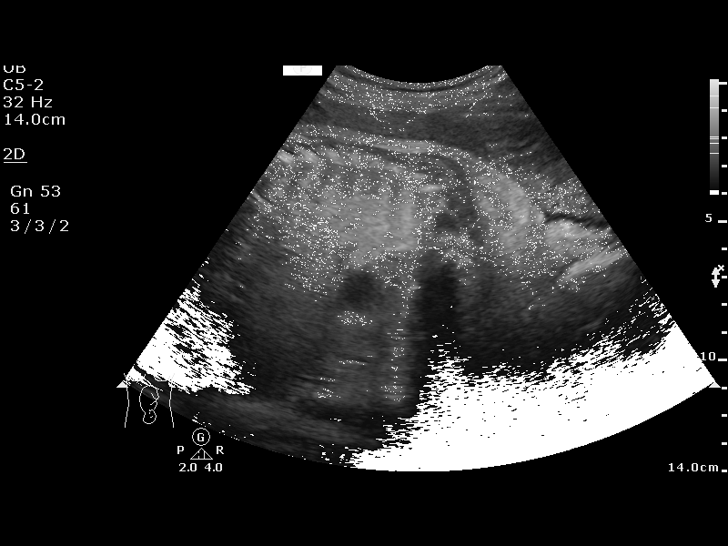
[im 4/13]
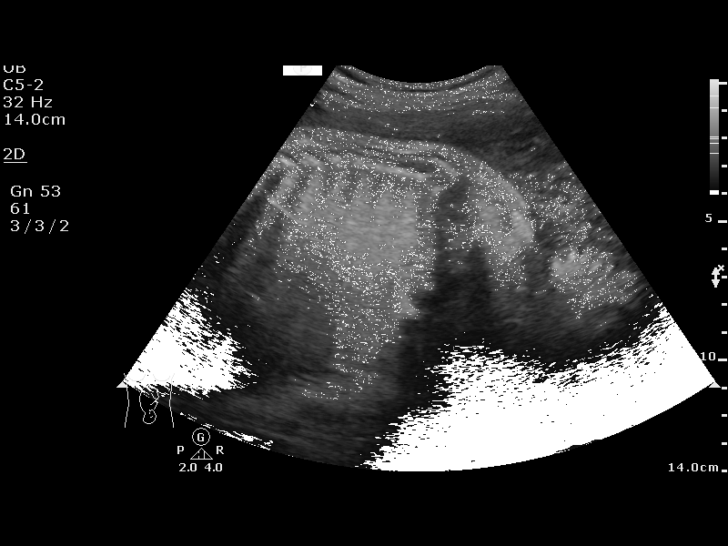
[im 5/13]
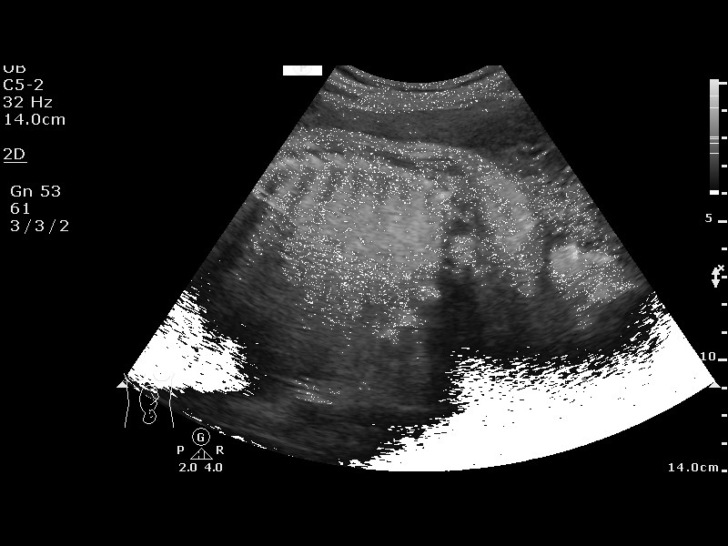
[im 6/13]
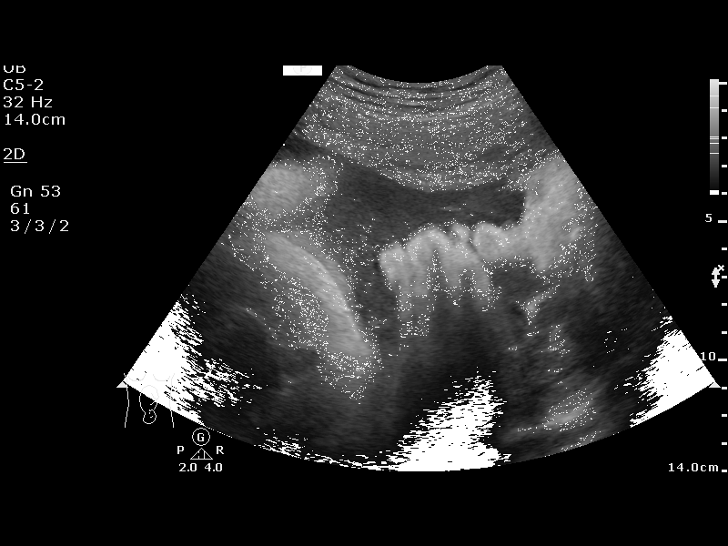
[im 7/13]
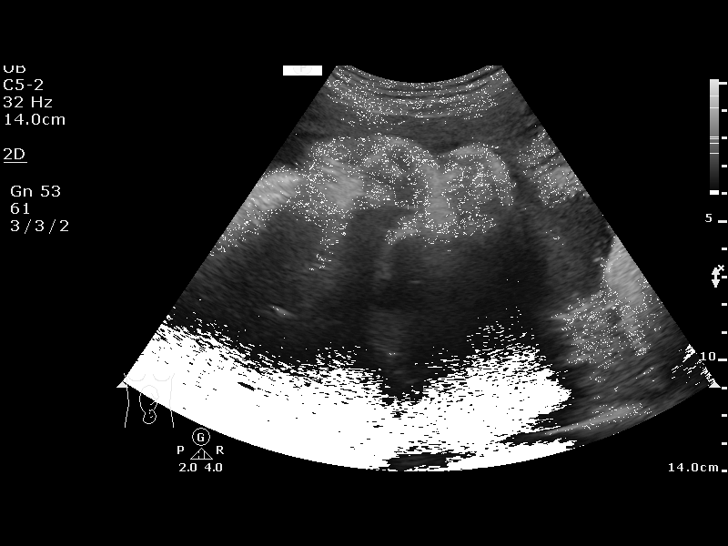
[im 8/13]
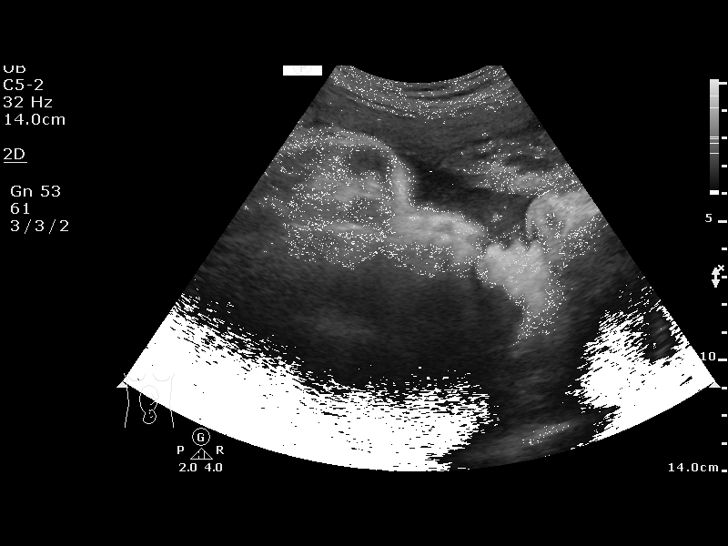
[im 9/13]
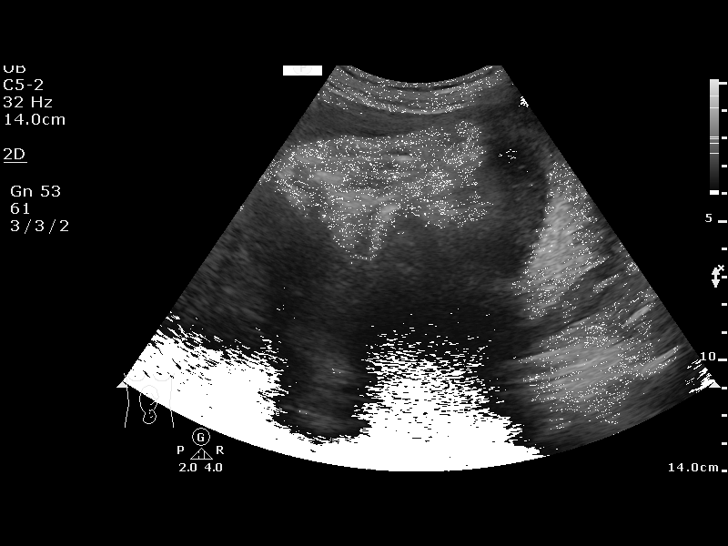
[im 10/13]
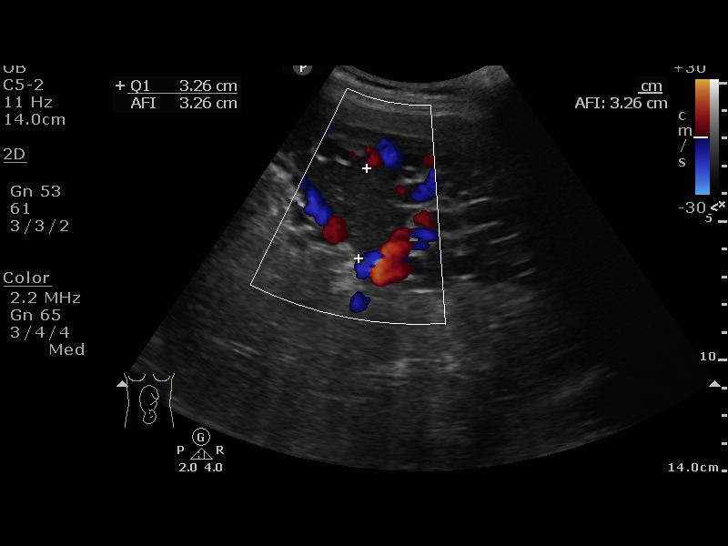
[im 11/13]
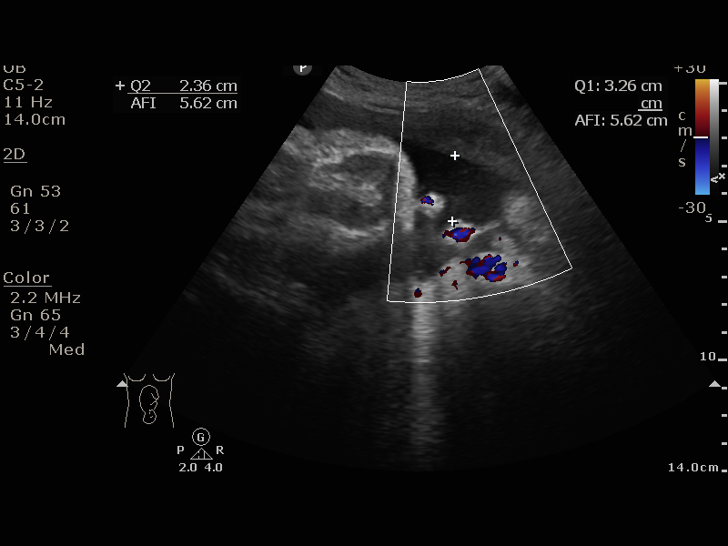
[im 12/13]
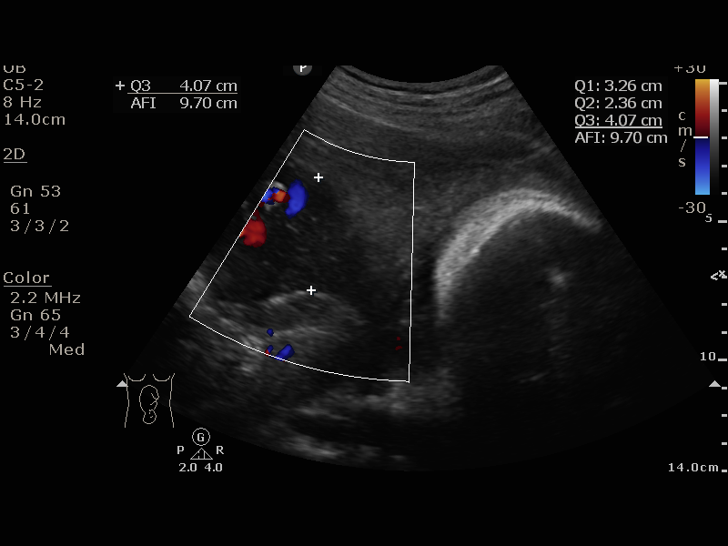
[im 13/13]
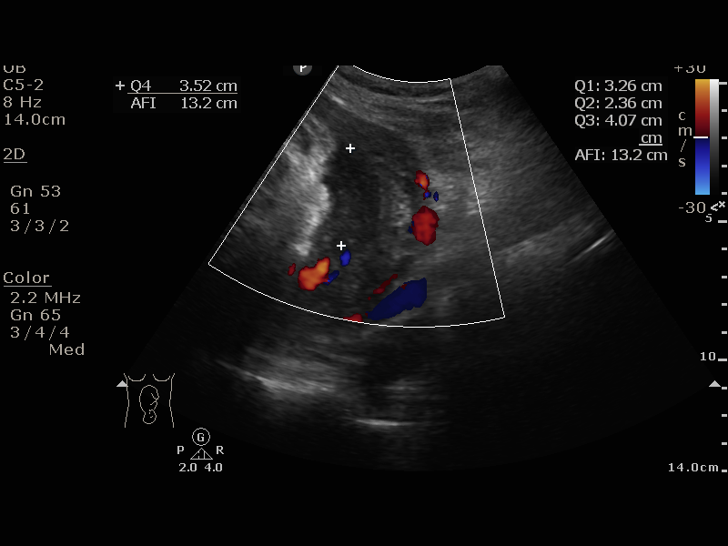

[13 of 13 positions shown; findings below may reference images not displayed]

OB/Gyn Clinic
Women's
[REDACTED]

1  US FETAL BPP W/NONSTRESS                    76818.4

1  BLESSING MARMARA                [id number]      [id number]     [id number]
Service(s) Provided

Indications

40 weeks gestation of pregnancy
Postdate pregnancy (40-42 weeks)
OB History

Gravidity:    3         Term:   2        Prem:   0        SAB:   0
TOP:          0       Ectopic:  0        Living: 2
Fetal Evaluation

Num Of Fetuses:     1
Preg. Location:     Intrauterine
Cardiac Activity:   Observed
Presentation:       Cephalic

Amniotic Fluid
AFI FV:      Subjectively within normal limits

AFI Sum(cm)     %Tile       Largest Pocket(cm)
13.21           56

RUQ(cm)       RLQ(cm)       LUQ(cm)        LLQ(cm)
3.26
Biophysical Evaluation

Amniotic F.V:   Pocket => 2 cm two         F. Tone:        Observed
planes
F. Movement:    Observed                   N.S.T:          Reactive
F. Breathing:   Observed                   Score:          [DATE]
Gestational Age

Best:          40w 3d     Det. By:  U/S C R L  (03/19/17)    EDD:   09/28/17
Impression

Post dates- reassuring testing
Recommendations

Continue testing until delivery

## 2019-12-02 ENCOUNTER — Encounter (HOSPITAL_COMMUNITY): Payer: Self-pay | Admitting: Emergency Medicine

## 2019-12-02 ENCOUNTER — Ambulatory Visit (HOSPITAL_COMMUNITY)
Admission: EM | Admit: 2019-12-02 | Discharge: 2019-12-02 | Disposition: A | Discharge status: Home / Self Care | Payer: Medicaid Other

## 2019-12-02 DIAGNOSIS — N939 Abnormal uterine and vaginal bleeding, unspecified: Secondary | ICD-10-CM | POA: Diagnosis not present

## 2019-12-02 DIAGNOSIS — Z3202 Encounter for pregnancy test, result negative: Secondary | ICD-10-CM

## 2019-12-02 LAB — POC URINE PREG, ED: Preg Test, Ur: NEGATIVE

## 2019-12-02 MED ORDER — MEGESTROL ACETATE 40 MG PO TABS: 40.0000 mg | ORAL_TABLET | Freq: Two times a day (BID) | ORAL | 0 refills | Status: AC

## 2019-12-02 NOTE — ED Triage Notes (Signed)
Pt c/o menstrual bleeding x 13 days. Pt states she has some pain and cramping. Pt states she has the nexplannon implant since June 2019.

## 2019-12-02 NOTE — Discharge Instructions (Signed)
Take the megace, 2 times a day until your follow up  Call any of the options supplied for Ob/gyn follow up  If bleeding becomes severe, you get light headed or have severe pain, return or go to the ED

## 2019-12-02 NOTE — ED Provider Notes (Signed)
[facility]    CSN: [id number] Arrival date & time: 12/02/19  1755      History   Chief Complaint Chief Complaint  Patient presents with  . Menstrual Problem    HPI Krystal Marshall is a 34 y.o. female.   Patient presents for evaluation of irregular menstrual bleeding.  She reports over the last 4 months she has had prolonged menstrual periods, bleeding 2 to 3 weeks at a time each month.  She reports this is very abnormal for her and she is concerned about this.  She reports she has had this issue before however this was not giving her any trouble as she was breast-feeding up until 4 months ago.  She reports following stopping breast-feeding she would have long menstrual periods each month.  She reports heavy bleeding for the first week or so which tapers to 1 pad a day.  She reports she is on Depo-Provera insert.  She denies any lightheaded feeling.  She reports she does not have an OB/GYN and attempted to go to to [facility] hospital however they directed to the family medicine center to establish care.  She is pending a appointment now.  She did report having menstrual cramping.  Denies any painful urination frequency urgency.   Patient requested a female provider however there is none on staff today.  She stated that she would not allow for exam with a female provider due to her religious believes.   Arabic interpreter was utilized for the duration of the visit.     Past Medical History:  Diagnosis Date  . Medical history non-contributory     Patient Active Problem List   Diagnosis Date Noted  . Status post cesarean section 10/03/2017  . Status post primary low transverse cesarean section 10/03/2017  . Post term pregnancy 10/03/2017  . Breech presentation, no version 09/24/2017  . Supervision of other normal pregnancy, antepartum 03/19/2017  . History of shoulder dystocia in prior pregnancy, currently pregnant 03/19/2017  . Language barrier affecting health care  04/22/2015    Past Surgical History:  Procedure Laterality Date  . CESAREAN SECTION N/A 10/03/2017   Procedure: CESAREAN SECTION;  Surgeon: Catalina Antigua, MD;  [facility];  Service: Obstetrics;  Laterality: N/A;  . NO PAST SURGERIES      OB History    Gravida  3   Para  3   Term  3   Preterm  0   AB  0   Living  3     SAB  0   TAB  0   Ectopic  0   Multiple  0   Live Births  3            Home Medications    Prior to Admission medications   Medication Sig Start Date End Date Taking? Authorizing Provider  etonogestrel (NEXPLANON) 68 MG IMPL implant 1 each by Subdermal route once.   Yes [provider]  cetirizine (ZYRTEC) 10 MG tablet Take 1 tablet (10 mg total) by mouth daily. 08/24/18   Georgetta Haber, NP  fluticasone (FLONASE) 50 MCG/ACT nasal spray Place 1 spray into both nostrils daily. 09/06/18   Fayrene Helper, PA-C  megestrol (MEGACE) 40 MG tablet Take 1 tablet (40 mg total) by mouth 2 (two) times daily for 14 days. 12/02/19 12/16/19  Myiah Petkus, Veryl Speak, PA-C  naphazoline-pheniramine (NAPHCON-A) 0.025-0.3 % ophthalmic solution Place 1 drop into both eyes every 4 (four) hours as needed for eye irritation or  allergies. 09/06/18   Fayrene Helper, PA-C    Family History Family History  Problem Relation Age of Onset  . Cancer Neg Hx   . Diabetes Neg Hx   . Hypertension Neg Hx     Social History Social History   Tobacco Use  . Smoking status: Never Smoker  . Smokeless tobacco: Never Used  Substance Use Topics  . Alcohol use: No  . Drug use: No     Allergies   Patient has no known allergies.   Review of Systems Review of Systems   Physical Exam Triage Vital Signs ED Triage Vitals  Enc Vitals Group     BP      Pulse      Resp      Temp      Temp src      SpO2      Weight      Height      Head Circumference      Peak Flow      Pain Score      Pain Loc      Pain Edu?      Excl. in GC?    No data found.  Updated  Vital Signs BP 124/73 (BP Location: Right Arm)   Pulse 76   Temp 99.3 F (37.4 C) (Oral)   Resp 15   SpO2 100%   Visual Acuity Right Eye Distance:   Left Eye Distance:   Bilateral Distance:    Right Eye Near:   Left Eye Near:    Bilateral Near:     Physical Exam Vitals and nursing note reviewed.  Constitutional:      Appearance: Normal appearance.  Neurological:     Mental Status: She is alert.      UC Treatments / Results  Labs (all labs ordered are listed, but only abnormal results are displayed) Labs Reviewed  POC URINE PREG, ED    EKG   Radiology No results found.  Procedures Procedures (including critical care time)  Medications Ordered in UC Medications - No data to display  Initial Impression / Assessment and Plan / UC Course  I have reviewed the triage vital signs and the nursing notes.  Pertinent labs & imaging results that were available during my care of the patient were reviewed by me and considered in my medical decision making (see chart for details).     #Abnormal uterine bleeding Patient is a 34 year old with abnormal uterine bleeding.  Urine pregnancy negative.  Discussed use of Megace until she has follow-up, patient agrees.  Patient declined blood work today for anemia screen.  Supplied numerous options for OB/GYN follow-up and recommended she continue to follow-up with family medicine center as well.  Return precautions discussed.  Patient verbalized understanding plan of care. Final Clinical Impressions(s) / UC Diagnoses   Final diagnoses:  Abnormal uterine bleeding     Discharge Instructions     Take the megace, 2 times a day until your follow up  Call any of the options supplied for Ob/gyn follow up  If bleeding becomes severe, you get light headed or have severe pain, return or go to the ED    ED Prescriptions    Medication Sig Dispense Auth. Provider   megestrol (MEGACE) 40 MG tablet Take 1 tablet (40 mg total) by  mouth 2 (two) times daily for 14 days. 28 tablet Aylin Rhoads, Veryl Speak, PA-C     PDMP not reviewed this encounter.   Amman Bartel, Veryl Speak,  PA-C 12/02/19 2052

## 2019-12-22 ENCOUNTER — Encounter (HOSPITAL_COMMUNITY): Payer: Self-pay | Admitting: Emergency Medicine

## 2019-12-22 ENCOUNTER — Emergency Department (HOSPITAL_COMMUNITY)
Admission: EM | Admit: 2019-12-22 | Discharge: 2019-12-22 | Disposition: A | Discharge status: Home / Self Care | Payer: BC Managed Care – PPO | Attending: Emergency Medicine | Admitting: Emergency Medicine

## 2019-12-22 ENCOUNTER — Other Ambulatory Visit: Payer: Self-pay

## 2019-12-22 ENCOUNTER — Emergency Department (HOSPITAL_COMMUNITY): Payer: BC Managed Care – PPO

## 2019-12-22 DIAGNOSIS — R109 Unspecified abdominal pain: Secondary | ICD-10-CM

## 2019-12-22 LAB — URINALYSIS, ROUTINE W REFLEX MICROSCOPIC
Bacteria, UA: NONE SEEN
Bilirubin Urine: NEGATIVE
Glucose, UA: NEGATIVE mg/dL
Ketones, ur: NEGATIVE mg/dL
Leukocytes,Ua: NEGATIVE
Nitrite: NEGATIVE
Protein, ur: NEGATIVE mg/dL
Specific Gravity, Urine: 1.016 (ref 1.005–1.030)
pH: 5 (ref 5.0–8.0)

## 2019-12-22 LAB — POC URINE PREG, ED: Preg Test, Ur: NEGATIVE

## 2019-12-22 MED ORDER — TRAMADOL HCL 50 MG PO TABS: 50.0000 mg | ORAL_TABLET | Freq: Four times a day (QID) | ORAL | 0 refills | Status: DC | PRN

## 2019-12-22 NOTE — ED Triage Notes (Signed)
Pt. Stated, I also went to [facility] on June 29 and gave me medication  And it help but it came back the pain.

## 2019-12-22 NOTE — Discharge Instructions (Signed)
As discussed, today's evaluation has generally been reassuring.  Please follow-up with your physician.  Return here for concerning changes in your condition.

## 2019-12-22 NOTE — ED Notes (Signed)
Pain is with walking, not when lying or sitting.

## 2019-12-22 NOTE — ED Notes (Signed)
Patient verbalizes understanding of discharge instructions. Opportunity for questioning and answers were provided. Armband removed by staff, pt discharged from ED.  

## 2019-12-22 NOTE — ED Triage Notes (Signed)
Pt. Stated, Krystal Marshall had left back or stomach pain for over a week. No other symptoms

## 2019-12-22 NOTE — ED Provider Notes (Signed)
[facility] EMERGENCY DEPARTMENT Provider Note   CSN: [id number] Arrival date & time: 12/22/19  0631     History Chief Complaint  Patient presents with  . Flank Pain    Krystal Marshall is a 34 y.o. female.  HPI    Patient presents concern of flank pain. Onset was at least a few days ago, has worsened over the past few days.  patient has history of ongoing dysmenorrhea, is currently attempting to arrange outpatient follow-up. She has no emesis, diarrhea, pain is persistently in left flank, radiating towards left inguinal area. No relief with ibuprofen.  Past Medical History:  Diagnosis Date  . Medical history non-contributory     Patient Active Problem List   Diagnosis Date Noted  . Status post cesarean section 10/03/2017  . Status post primary low transverse cesarean section 10/03/2017  . Post term pregnancy 10/03/2017  . Breech presentation, no version 09/24/2017  . Supervision of other normal pregnancy, antepartum 03/19/2017  . History of shoulder dystocia in prior pregnancy, currently pregnant 03/19/2017  . Language barrier affecting health care 04/22/2015    Past Surgical History:  Procedure Laterality Date  . CESAREAN SECTION N/A 10/03/2017   Procedure: CESAREAN SECTION;  Surgeon: Catalina Antigua, MD;  Location: [facility];  Service: Obstetrics;  Laterality: N/A;  . NO PAST SURGERIES       OB History    Gravida  3   Para  3   Term  3   Preterm  0   AB  0   Living  3     SAB  0   TAB  0   Ectopic  0   Multiple  0   Live Births  3           Family History  Problem Relation Age of Onset  . Cancer Neg Hx   . Diabetes Neg Hx   . Hypertension Neg Hx     Social History   Tobacco Use  . Smoking status: Never Smoker  . Smokeless tobacco: Never Used  Substance Use Topics  . Alcohol use: No  . Drug use: No    Home Medications Prior to Admission medications   Medication Sig Start Date End Date Taking?  Authorizing Provider  etonogestrel (NEXPLANON) 68 MG IMPL implant 1 each by Subdermal route once.   Yes [provider]  ibuprofen (ADVIL) 200 MG tablet Take 200 mg by mouth every 6 (six) hours as needed for fever, headache or moderate pain.   Yes [provider]  cetirizine (ZYRTEC) 10 MG tablet Take 1 tablet (10 mg total) by mouth daily. Patient not taking: Reported on 12/22/2019 08/24/18   Georgetta Haber, NP  fluticasone (FLONASE) 50 MCG/ACT nasal spray Place 1 spray into both nostrils daily. Patient not taking: Reported on 12/22/2019 09/06/18   Fayrene Helper, PA-C  naphazoline-pheniramine (NAPHCON-A) 0.025-0.3 % ophthalmic solution Place 1 drop into both eyes every 4 (four) hours as needed for eye irritation or allergies. Patient not taking: Reported on 12/22/2019 09/06/18   Fayrene Helper, PA-C  traMADol (ULTRAM) 50 MG tablet Take 1 tablet (50 mg total) by mouth every 6 (six) hours as needed. 12/22/19   Gerhard Munch, MD    Allergies    Patient has no known allergies.  Review of Systems   Review of Systems  Constitutional:       Per HPI, otherwise negative  HENT:       Per HPI, otherwise negative  Respiratory:  Per HPI, otherwise negative  Cardiovascular:       Per HPI, otherwise negative  Gastrointestinal: Negative for vomiting.  Endocrine:       Negative aside from HPI  Genitourinary:       Neg aside from HPI   Musculoskeletal:       Per HPI, otherwise negative  Skin: Negative.   Neurological: Negative for syncope.    Physical Exam Updated Vital Signs BP 120/71   Pulse 70   Temp 98.7 F (37.1 C) (Oral)   Resp 16   SpO2 100%   Physical Exam Vitals and nursing note reviewed.  Constitutional:      General: She is not in acute distress.    Appearance: She is well-developed.  HENT:     Head: Normocephalic and atraumatic.  Eyes:     Conjunctiva/sclera: Conjunctivae normal.  Cardiovascular:     Rate and Rhythm: Normal rate and regular rhythm.    Pulmonary:     Effort: Pulmonary effort is normal. No respiratory distress.     Breath sounds: Normal breath sounds. No stridor.  Abdominal:     General: There is no distension.  Skin:    General: Skin is warm and dry.  Neurological:     Mental Status: She is alert and oriented to person, place, and time.     Cranial Nerves: No cranial nerve deficit.      ED Results / Procedures / Treatments   Labs (all labs ordered are listed, but only abnormal results are displayed) Labs Reviewed  URINALYSIS, ROUTINE W REFLEX MICROSCOPIC - Abnormal; Notable for the following components:      Result Value   Hgb urine dipstick SMALL (*)    All other components within normal limits  POC URINE PREG, ED    EKG None  Radiology CT Renal Stone Study  Result Date: 12/22/2019 CLINICAL DATA:  Acute flank pain. EXAM: CT ABDOMEN AND PELVIS WITHOUT CONTRAST TECHNIQUE: Multidetector CT imaging of the abdomen and pelvis was performed following the standard protocol without IV contrast. COMPARISON:  None. FINDINGS: Lower chest: No acute abnormality. Hepatobiliary: No focal liver abnormality is seen. No gallstones, gallbladder wall thickening, or biliary dilatation. Pancreas: Unremarkable. No pancreatic ductal dilatation or surrounding inflammatory changes. Spleen: Normal in size without focal abnormality. Adrenals/Urinary Tract: Adrenal glands are unremarkable. Kidneys are normal, without renal calculi, focal lesion, or hydronephrosis. Bladder is unremarkable. Stomach/Bowel: Stomach is within normal limits. Appendix appears normal. No evidence of bowel wall thickening, distention, or inflammatory changes. Vascular/Lymphatic: No significant vascular findings are present. No enlarged abdominal or pelvic lymph nodes. Reproductive: Uterus and bilateral adnexa are unremarkable. Other: No abdominal wall hernia or abnormality. No abdominopelvic ascites. Musculoskeletal: No acute or significant osseous findings.  IMPRESSION: No abnormality seen in the abdomen or pelvis. Electronically Signed   By: Lupita Raider M.D.   On: 12/22/2019 14:40    Procedures Procedures (including critical care time)  Medications Ordered in ED Medications - No data to display  ED Course  I have reviewed the triage vital signs and the nursing notes.  Pertinent labs & imaging results that were available during my care of the patient were reviewed by me and considered in my medical decision making (see chart for details).  Clinical Course as of Dec 22 1534  Mon Dec 22, 2019  1246 Mucus: PRESENT [AS]    Clinical Course User Index [AS] Ardith Dark   MDM Rules/Calculators/A&P  3:36 PM On repeat exam the patient is awake, alert, in no distress.  I reviewed the findings with her via video translator device. With reassuring CT scan, no evidence for stone, no evidence for intestinal blockage, infection, some suspicion persists for the patient's gynecologic issues contributing to her ongoing pain. Given the absence of hemodynamic instability, persistency of pain for several days, no evidence for peritonitis, patient appropriate for outpatient follow-up, to which she is amenable.   MDM Number of Diagnoses or Management Options Flank pain: new, needed workup   Amount and/or Complexity of Data Reviewed Clinical lab tests: reviewed Tests in the radiology section of CPT: reviewed Tests in the medicine section of CPT: reviewed Decide to obtain previous medical records or to obtain history from someone other than the patient: yes Obtain history from someone other than the patient: yes Review and summarize past medical records: yes Independent visualization of images, tracings, or specimens: yes  Risk of Complications, Morbidity, and/or Mortality Presenting problems: high Diagnostic procedures: high Management options: high  Critical Care Total time providing critical care: <  30 minutes  Patient Progress Patient progress: stable  Final Clinical Impression(s) / ED Diagnoses Final diagnoses:  Flank pain    Rx / DC Orders ED Discharge Orders         Ordered    traMADol (ULTRAM) 50 MG tablet  Every 6 hours PRN     Discontinue  Reprint     12/22/19 1522           Gerhard Munch, MD 12/22/19 1537

## 2020-01-19 ENCOUNTER — Encounter: Payer: Self-pay | Admitting: Family Medicine

## 2020-01-19 ENCOUNTER — Ambulatory Visit (INDEPENDENT_AMBULATORY_CARE_PROVIDER_SITE_OTHER): Payer: BC Managed Care – PPO | Admitting: Family Medicine

## 2020-01-19 ENCOUNTER — Other Ambulatory Visit: Payer: Self-pay

## 2020-01-19 VITALS — BP 112/70 | HR 62 | Ht 64.5 in | Wt 171.8 lb

## 2020-01-19 DIAGNOSIS — N925 Other specified irregular menstruation: Secondary | ICD-10-CM

## 2020-01-19 DIAGNOSIS — N926 Irregular menstruation, unspecified: Secondary | ICD-10-CM | POA: Diagnosis not present

## 2020-01-19 DIAGNOSIS — Z1159 Encounter for screening for other viral diseases: Secondary | ICD-10-CM

## 2020-01-19 DIAGNOSIS — Z7689 Persons encountering health services in other specified circumstances: Secondary | ICD-10-CM | POA: Diagnosis not present

## 2020-01-19 MED ORDER — NORGESTIMATE-ETH ESTRADIOL 0.25-35 MG-MCG PO TABS: 1.0000 | ORAL_TABLET | Freq: Every day | ORAL | 11 refills | Status: DC

## 2020-01-19 NOTE — Patient Instructions (Signed)
Thank you for coming in to see [place] today! Please see below to review our plan for today's visit:  1. Take Sprintec (birth control) every day for 3 months. Please come back and see [place] in 1-2 months to take our your Nexplanon.  2. Please come back if your periods are no better. If they are NORMAL you do NOT have to come back.  3. We are checking Thyroid function, blood levels (for anemia), and Hep C.   Please come back in 2-3 months.   Please call the clinic at [phone] if your symptoms worsen or you have any concerns. It was our pleasure to serve you!   Dr. Peggyann Shoals [facility]

## 2020-01-19 NOTE — Progress Notes (Signed)
    SUBJECTIVE:   CHIEF COMPLAINT / HPI:   New patient encounter: this is a pleasant Arabic-speaking 33 year old [id number] female patient presenting to the [facility] to establish care.   Prolonged menstrual bleeding: the patient has a nexplanon in since giving birth in 2019. She has been breastfeeding her child until about 7 months ago. When she stopped breast feeding 7 months ago she reports she started having prolonged periods. A normal period for her lasts 7 days, however her now last 15 days and occur about every 2 months or so. She still has her Nexplanon in. She denies any symptoms such as fatigue, dizziness, light headedness, shortness of breath or chest pain. She has no personal or family history of anemia or thyroid disorder. She does not desire to have any more children at this time.   Health maintenance: patient would benefit from COVID 19 vaccine, Hep C screen, and Pap smear.   PERTINENT  PMH / PSH:  Patient Active Problem List   Diagnosis Date Noted  . Prolonged periods 01/24/2020  . Need for hepatitis C screening test 01/24/2020  . Encounter to establish care with new doctor 01/24/2020  . Status post cesarean section 10/03/2017  . Status post primary low transverse cesarean section 10/03/2017  . Post term pregnancy 10/03/2017  . Breech presentation, no version 09/24/2017  . Supervision of other normal pregnancy, antepartum 03/19/2017  . History of shoulder dystocia in prior pregnancy, currently pregnant 03/19/2017  . Language barrier affecting health care 04/22/2015     OBJECTIVE:   BP 112/70   Pulse 62   Ht 5' 4.5" (1.638 m)   Wt 171 lb 12.8 oz (77.9 kg)   LMP 01/11/2020   SpO2 98%   BMI 29.03 kg/m    Physical exam: General: well appearing, no pallor appreciated Respiratory: CTA bilaterally Cardio: RRR S1S2 present Abdomen: soft, nontender, normal bowel sounds   ASSESSMENT/PLAN:   Need for hepatitis C screening test -Patient screened for hep C, result was  negative  Prolonged periods Strong suspicion patient's bleeding is due to being on Nexplanon, may require it to be taken out. She has tried Megace to reduce her bleeding but this upset her stomach a lot. -take sprintec daily for 3 months. Can come back to see [place] in 1-2 months as desired to remove Nexplanon.  - Patient asked to return if periods are no better. Does not need to come back if they normal.  - Check Thyroid function, blood levels (for anemia), and Hep C     Dollene Cleveland, DO [facility]

## 2020-01-20 LAB — THYROID PANEL WITH TSH
Free Thyroxine Index: 2.2 (ref 1.2–4.9)
T3 Uptake Ratio: 29 % (ref 24–39)
T4, Total: 7.7 ug/dL (ref 4.5–12.0)
TSH: 2.11 u[IU]/mL (ref 0.450–4.500)

## 2020-01-20 LAB — CBC
Hematocrit: 36.7 % (ref 34.0–46.6)
Hemoglobin: 12.4 g/dL (ref 11.1–15.9)
MCH: 27 pg (ref 26.6–33.0)
MCHC: 33.8 g/dL (ref 31.5–35.7)
MCV: 80 fL (ref 79–97)
Platelets: 333 10*3/uL (ref 150–450)
RBC: 4.6 x10E6/uL (ref 3.77–5.28)
RDW: 13.7 % (ref 11.7–15.4)
WBC: 10.5 10*3/uL (ref 3.4–10.8)

## 2020-01-20 LAB — HEPATITIS C ANTIBODY: Hep C Virus Ab: <0.1 s/co ratio (ref 0.0–0.9)

## 2020-01-24 DIAGNOSIS — Z7689 Persons encountering health services in other specified circumstances: Secondary | ICD-10-CM

## 2020-01-24 DIAGNOSIS — Z1159 Encounter for screening for other viral diseases: Secondary | ICD-10-CM | POA: Insufficient documentation

## 2020-01-24 DIAGNOSIS — N926 Irregular menstruation, unspecified: Secondary | ICD-10-CM | POA: Insufficient documentation

## 2020-01-24 HISTORY — DX: Persons encountering health services in other specified circumstances: Z76.89

## 2020-01-24 HISTORY — DX: Encounter for screening for other viral diseases: Z11.59

## 2020-01-24 NOTE — Assessment & Plan Note (Signed)
Strong suspicion patient's bleeding is due to being on Nexplanon, may require it to be taken out. She has tried Megace to reduce her bleeding but this upset her stomach a lot. -take sprintec daily for 3 months. Can come back to see [place] in 1-2 months as desired to remove Nexplanon.  - Patient asked to return if periods are no better. Does not need to come back if they normal.  - Check Thyroid function, blood levels (for anemia), and Hep C

## 2020-01-24 NOTE — Assessment & Plan Note (Signed)
-  Patient screened for hep C, result was negative

## 2020-01-27 ENCOUNTER — Telehealth: Payer: Self-pay

## 2020-01-27 NOTE — Telephone Encounter (Signed)
Called patient with arabic interpreter [phone] to go over normal lab results. Patient verbalized understanding and will call back in September to schedule appointment for October.   Veronda Prude, RN

## 2020-03-25 ENCOUNTER — Other Ambulatory Visit: Payer: Self-pay

## 2020-03-25 ENCOUNTER — Encounter: Payer: Self-pay | Admitting: Family Medicine

## 2020-03-25 ENCOUNTER — Ambulatory Visit (INDEPENDENT_AMBULATORY_CARE_PROVIDER_SITE_OTHER): Payer: BC Managed Care – PPO | Admitting: Family Medicine

## 2020-03-25 VITALS — BP 120/74 | HR 78 | Ht 65.0 in | Wt 171.0 lb

## 2020-03-25 DIAGNOSIS — N926 Irregular menstruation, unspecified: Secondary | ICD-10-CM | POA: Diagnosis not present

## 2020-03-25 DIAGNOSIS — Z23 Encounter for immunization: Secondary | ICD-10-CM

## 2020-03-25 NOTE — Assessment & Plan Note (Signed)
-  Patient received first dose of COVID-19 vaccine today Proofreader)

## 2020-03-25 NOTE — Assessment & Plan Note (Signed)
-  Patient will take Sprintec 1 tablet daily for 2 months -Instructed to follow-up in 3 months only if periods remain prolonged -At the next visit, patient can opt to have Nexplanon removed and a new 1 inserted.  She can also opt to have Nexplanon removed and start Depo-Provera shots. -Can also offer tubal ligation as patient says she is done having children

## 2020-03-25 NOTE — Patient Instructions (Addendum)
Thank you for coming in to see [place] today! Please see below to review our plan for today's visit:  1. Follow up in 2-3 months if you are still having prolonged periods.  If you are continuing to have longer periods we will plan to remove the Nexplanon and give you the Depo-Provera shot. 2. Take the Sprintec 1 tablet every day for 2 months. 3.  Congratulations on getting your Covid vaccine today!  Come back in 3 weeks for your second shot.  Please call the clinic at [phone] if your symptoms worsen or you have any concerns. It was our pleasure to serve you!   Dr. Peggyann Shoals [facility]

## 2020-03-25 NOTE — Progress Notes (Signed)
    SUBJECTIVE:   CHIEF COMPLAINT / HPI:   Prolonged periods: patient was previously seen Jan 19, 2020 for prolonged periods. The patient has a nexplanon in since giving birth in 2019. She has been breastfeeding her child until about Jan 2021. When she stopped breast feeding she reports she started having prolonged periods. A normal period for her lasts 7 days, however hers now last 15 days and occur about every 2 months or so. She still has her Nexplanon in. She denies any symptoms such as fatigue, dizziness, light headedness, shortness of breath or chest pain. She has no personal or family history of anemia or thyroid disorder. She does not desire to have any more children at this time.  Thyroid function and a CBC were checked - both were normal   In August she was prescribed Sprintec for 3 months to to help re-synchronize her cycle and decrease duration of bleeding. She was instructed to return in 1-2 months if she desired to remove Nexplanon.   Today she reports for the last 2 months or so she had normal periods. She said she did not take the Sprintec. She then had 12 days of a period over the last 2 weeks. She took 2 of the Megace pills she had left over from before.   PERTINENT  PMH / PSH:  Patient Active Problem List   Diagnosis Date Noted  . COVID-19 vaccine administered 03/25/2020  . Prolonged periods 01/24/2020  . Language barrier affecting health care 04/22/2015    OBJECTIVE:   BP 120/74   Pulse 78   Ht 5\' 5"  (1.651 m)   Wt 171 lb (77.6 kg)   LMP 03/13/2020 (Exact Date)   SpO2 98%   Breastfeeding No   BMI 28.46 kg/m    Physical exam: General: Well-appearing, no apparent distress Respiratory: Comfortable work of breathing, speaking complete sentences    ASSESSMENT/PLAN:   COVID-19 vaccine administered -Patient received first dose of COVID-19 vaccine today 05/13/2020)  Prolonged periods -Patient will take Sprintec 1 tablet daily for 2 months -Instructed to  follow-up in 3 months only if periods remain prolonged -At the next visit, patient can opt to have Nexplanon removed and a new 1 inserted.  She can also opt to have Nexplanon removed and start Depo-Provera shots. -Can also offer tubal ligation as patient says she is done having children     Proofreader, DO [facility]

## 2020-04-22 ENCOUNTER — Ambulatory Visit: Payer: Medicaid Other

## 2020-04-26 ENCOUNTER — Ambulatory Visit (INDEPENDENT_AMBULATORY_CARE_PROVIDER_SITE_OTHER): Payer: BC Managed Care – PPO

## 2020-04-26 ENCOUNTER — Other Ambulatory Visit: Payer: Self-pay

## 2020-04-26 DIAGNOSIS — Z23 Encounter for immunization: Secondary | ICD-10-CM

## 2020-04-26 NOTE — Progress Notes (Signed)
   Covid-19 Vaccination Clinic  Name:  Ellice Boultinghouse    MRN: [id number] DOB: 04-16-1986  04/26/2020  Ms. Middleton was observed post Covid-19 immunization for 15 minutes without incident. She was provided with Vaccine Information Sheet and instruction to access the V-Safe system.   Ms. Askren was instructed to call 911 with any severe reactions post vaccine: Marland Kitchen Difficulty breathing  . Swelling of face and throat  . A fast heartbeat  . A bad rash all over body  . Dizziness and weakness   #2 Covid Vaccine administered RD without complication.

## 2020-05-31 ENCOUNTER — Other Ambulatory Visit: Payer: Self-pay

## 2020-05-31 ENCOUNTER — Ambulatory Visit (HOSPITAL_COMMUNITY)
Admission: EM | Admit: 2020-05-31 | Discharge: 2020-05-31 | Disposition: A | Discharge status: Home / Self Care | Payer: Medicaid Other | Attending: Student | Admitting: Student

## 2020-05-31 ENCOUNTER — Encounter (HOSPITAL_COMMUNITY): Payer: Self-pay

## 2020-05-31 DIAGNOSIS — T148XXA Other injury of unspecified body region, initial encounter: Secondary | ICD-10-CM

## 2020-05-31 MED ORDER — NAPROXEN 500 MG PO TABS: 500.0000 mg | ORAL_TABLET | Freq: Two times a day (BID) | ORAL | 0 refills | Status: DC

## 2020-05-31 MED ORDER — METAXALONE 800 MG PO TABS: 800.0000 mg | ORAL_TABLET | Freq: Three times a day (TID) | ORAL | 0 refills | Status: DC

## 2020-05-31 NOTE — Discharge Instructions (Addendum)
For your back pain,  -Start the metaxalone (muscle relaxer). You can take this up to 3x daily. This may make you tired, so take it when you don't need to drive.  -Take the naproxen for pain, twice daily. Take this with food.  -use your icy hot pads

## 2020-05-31 NOTE — ED Triage Notes (Signed)
Pt in with c/o mid back pain that started yesterday after she" held heavy items " for a long time.  Pt has taken tylenol and ibuprofen with no relief

## 2020-05-31 NOTE — ED Provider Notes (Signed)
[facility]    CSN: [id number] Arrival date & time: 05/31/20  1356      History   Chief Complaint Chief Complaint  Patient presents with   Back Pain    HPI Krystal Marshall is a 34 y.o. female presenting for back pain. History of heavy periods. States she has had back pain since she carried heavy things yesterday. She picked up several heavy bags and few minutes later her right back started hurting. Denies pain shooting down legs, denies bowel/bladder incontinence, denies weakness or sensation changes in extremities. Denies urinary symptoms.  Denies history of back issues previously. Has been using icy hot with some relief.  HPI  Past Medical History:  Diagnosis Date   Breech presentation, no version 09/24/2017   Encounter to establish care with new doctor 01/24/2020   History of shoulder dystocia in prior pregnancy, currently pregnant 03/19/2017   Medical history non-contributory    Need for hepatitis C screening test 01/24/2020   Post term pregnancy 10/03/2017   Status post cesarean section 10/03/2017   T-incision; low vertical.  Please schedule this patient for Postpartum visit in: 4 weeks with the following provider: Any provider For C/S patients schedule nurse incision check in weeks 2 weeks: yes Low risk pregnancy complicated by: None Delivery mode:  CS Anticipated Birth Control:  Depo PP Procedures needed: Incision check  Schedule Integrated BH visit: no      Status post primary low transverse cesarean section 10/03/2017   Supervision of other normal pregnancy, antepartum 03/19/2017    Clinic [facility] Prenatal Labs Dating u/s Blood type: AB/Positive/-- (10/15 1041)  Genetic Screen  Quad: negative Antibody:Negative (10/15 1041) Anatomic [place]  Normal anatomy, ? Marginal previa [x ] re-eval mid January- resolved  Rubella: 28.80 (10/15 1041) GTT Early:  N/A             Third trimester: 79-140-102 RPR: Non Reactive (10/15 1041)  Flu vaccine 03/19/17 HBsAg: Negative (10/15  1041)  TDaP va    Patient Active Problem List   Diagnosis Date Noted   COVID-19 vaccine administered 03/25/2020   Prolonged periods 01/24/2020   Language barrier affecting health care 04/22/2015    Past Surgical History:  Procedure Laterality Date   CESAREAN SECTION N/A 10/03/2017   Procedure: CESAREAN SECTION;  Surgeon: Catalina Antigua, MD;  [facility];  Service: Obstetrics;  Laterality: N/A;   NO PAST SURGERIES      OB History    Gravida  3   Para  3   Term  3   Preterm  0   AB  0   Living  3     SAB  0   IAB  0   Ectopic  0   Multiple  0   Live Births  3            Home Medications    Prior to Admission medications   Medication Sig Start Date End Date Taking? Authorizing Provider  metaxalone (SKELAXIN) 800 MG tablet Take 1 tablet (800 mg total) by mouth 3 (three) times daily. 05/31/20  Yes Rhys Martini, PA-C  naproxen (NAPROSYN) 500 MG tablet Take 1 tablet (500 mg total) by mouth 2 (two) times daily. 05/31/20  Yes Rhys Martini, PA-C  etonogestrel (NEXPLANON) 68 MG IMPL implant 1 each by Subdermal route once.    [provider]  ibuprofen (ADVIL) 200 MG tablet Take 200 mg by mouth every 6 (six) hours as needed for fever, headache or  moderate pain.    [provider]  norgestimate-ethinyl estradiol (SPRINTEC 28) 0.25-35 MG-MCG tablet Take 1 tablet by mouth daily. 01/19/20   Dollene Cleveland, DO  traMADol (ULTRAM) 50 MG tablet Take 1 tablet (50 mg total) by mouth every 6 (six) hours as needed. 12/22/19   Gerhard Munch, MD    Family History Family History  Problem Relation Age of Onset   Cancer Neg Hx    Diabetes Neg Hx    Hypertension Neg Hx     Social History Social History   Tobacco Use   Smoking status: Never Smoker   Smokeless tobacco: Never Used  Substance Use Topics   Alcohol use: No   Drug use: No     Allergies   Patient has no known allergies.   Review of Systems Review  of Systems  Musculoskeletal: Positive for back pain.  All other systems reviewed and are negative.    Physical Exam Triage Vital Signs ED Triage Vitals  Enc Vitals Group     BP 05/31/20 1714 132/80     Pulse Rate 05/31/20 1714 78     Resp 05/31/20 1714 18     Temp 05/31/20 1714 99.5 F (37.5 C)     Temp Source 05/31/20 1714 Oral     SpO2 05/31/20 1714 98 %     Weight --      Height --      Head Circumference --      Peak Flow --      Pain Score 05/31/20 1713 6     Pain Loc --      Pain Edu? --      Excl. in [place]? --    No data found.  Updated Vital Signs BP 132/80 (BP Location: Right Arm)    Pulse 78    Temp 99.5 F (37.5 C) (Oral)    Resp 18    SpO2 98%   Visual Acuity Right Eye Distance:   Left Eye Distance:   Bilateral Distance:    Right Eye Near:   Left Eye Near:    Bilateral Near:     Physical Exam Vitals reviewed.  Constitutional:      General: She is not in acute distress.    Appearance: Normal appearance. She is not ill-appearing.  HENT:     Head: Normocephalic and atraumatic.  Cardiovascular:     Rate and Rhythm: Normal rate and regular rhythm.     Heart sounds: Normal heart sounds.  Pulmonary:     Effort: Pulmonary effort is normal.     Breath sounds: Normal breath sounds and air entry.  Abdominal:     Tenderness: There is no abdominal tenderness. There is no right CVA tenderness, left CVA tenderness, guarding or rebound.     Comments: No bowel or bladder incontinence.  Musculoskeletal:     Cervical back: Normal range of motion. No swelling, deformity, signs of trauma, rigidity, spasms, tenderness, bony tenderness or crepitus. No pain with movement.     Thoracic back: Spasms and tenderness present. No swelling, deformity, signs of trauma or bony tenderness. Normal range of motion. No scoliosis.     Lumbar back: No swelling, deformity, signs of trauma, spasms, tenderness or bony tenderness. Normal range of motion. Negative right straight leg raise  test and negative left straight leg raise test. No scoliosis.     Comments: Strength 5/5 in UEs and LEs. Right thoracic paraspinous muscle tenderness to deep palpation. ROM and strength intact with UEs  and LEs. No spinous point tenderness.   Neurological:     General: No focal deficit present.     Mental Status: She is alert and oriented to person, place, and time.     Cranial Nerves: No cranial nerve deficit.     Comments: Strength 5/5 in UEs and LEs. Gait normal. Sensation intact in UEs and LEs. CN 2-12 intact.   Psychiatric:        Mood and Affect: Mood normal.        Behavior: Behavior normal.        Thought Content: Thought content normal.        Judgment: Judgment normal.      UC Treatments / Results  Labs (all labs ordered are listed, but only abnormal results are displayed) Labs Reviewed - No data to display  EKG   Radiology No results found.  Procedures Procedures (including critical care time)  Medications Ordered in UC Medications - No data to display  Initial Impression / Assessment and Plan / UC Course  I have reviewed the triage vital signs and the nursing notes.  Pertinent labs & imaging results that were available during my care of the patient were reviewed by me and considered in my medical decision making (see chart for details).     Denies pain shooting down legs, denies numbness in arms/legs, denies weakness in arms/legs, denies saddle anesthesia, denies bowel/bladder incontinence.   For thoracic paraspinous muscle strain, metaxalone and naproxen sent as below. Continue using heat.   Final Clinical Impressions(s) / UC Diagnoses   Final diagnoses:  Muscle strain     Discharge Instructions     For your back pain,  -Start the metaxalone (muscle relaxer). You can take this up to 3x daily. This may make you tired, so take it when you don't need to drive.  -Take the naproxen for pain, twice daily. Take this with food.  -use your icy hot  pads    ED Prescriptions    Medication Sig Dispense Auth. Provider   metaxalone (SKELAXIN) 800 MG tablet Take 1 tablet (800 mg total) by mouth 3 (three) times daily. 21 tablet Rhys Martini, PA-C   naproxen (NAPROSYN) 500 MG tablet Take 1 tablet (500 mg total) by mouth 2 (two) times daily. 30 tablet Rhys Martini, PA-C     PDMP not reviewed this encounter.   Rhys Martini, PA-C 05/31/20 1830

## 2020-07-06 DIAGNOSIS — Z3040 Encounter for surveillance of contraceptives, unspecified: Secondary | ICD-10-CM | POA: Insufficient documentation

## 2020-07-06 NOTE — Progress Notes (Unsigned)
    SUBJECTIVE:   CHIEF COMPLAINT / HPI:   Translated by Christen Bame #[id number]  Birth Control Management: has Nexplanon since June 2019, is traveling to [facility] in 1-2 months for 4-5 months.  Patient is requesting the Nexplanon be removed and then reinserted as the Nexplanon is set to expire when she will be overseas in [place]. She is sexually active with one female partner, denies vaginal discharge, dyspareunia, vaginal odor.  PERTINENT  PMH / PSH:  Language barrier affecting healthcare, prolonged periods  OBJECTIVE:   BP 118/70   Pulse 100   Ht 5\' 5"  (1.651 m)   Wt 173 lb (78.5 kg)   LMP 06/16/2020   SpO2 98%   BMI 28.79 kg/m    Physical exam: General: Well-appearing patient, no apparent distress Respiratory: Comfortable work of breathing, speaking complete sentences Cardio: Extremities warm/well-perfused, Nexplanon palpable to medial aspect of left brachium   PROCEDURE NOTE: NEXPLANON  REMOVAL AND REINSERTION PRE-OP DIAGNOSIS: desired long-term, reversible contraception  POST-OP DIAGNOSIS: Same  Performing Physician: 08/14/2020   PROCEDURE:  Written informed consent obtained Site (check): Left arm        Sterile Preparation:    [X]       Betadine        [_]     Chloraprep      Nexplanon Removal: Patient given informed consent and signed copy in the chart. Left arm area prepped and draped in the usual sterile fashion.  4.5 cc of lidocaine without epinephrine 1% used for local anesthesia.  Anesthesia confirmed. A small stab incision was made close to the distal aspect of nexplanon with 11-blade scalpel. Hemostats were used to withdraw the nexplanon.        Nexplanon reinsertion: Nexplanon  trocar was inserted subcutaneously and then Nexplanon  capsule delivered subcutaneously. Trocar was removed from the insertion site. Nexplanon  capsule was palpated by provider and patient to assure satisfactory placement.  Estimated blood loss: minimal Dressings applied:  Steri-Strips x3, 2 x 2 gauze and Coban Followup: The patient tolerated the procedure well without complications.  Standard post-procedure care wass explained and return precautions were given.  Lot number: Dollene Cleveland   ASSESSMENT/PLAN:   Encounter for removal and reinsertion of Nexplanon Patient had Nexplanon removed and replaced today 07/07/2020. -Patient given instructions post procedure for pain, bruising. -Nexplanon to be replaced 07/07/2023     09/04/2020, DO [facility]

## 2020-07-07 ENCOUNTER — Other Ambulatory Visit: Payer: Self-pay

## 2020-07-07 ENCOUNTER — Encounter: Payer: Self-pay | Admitting: Family Medicine

## 2020-07-07 ENCOUNTER — Ambulatory Visit (INDEPENDENT_AMBULATORY_CARE_PROVIDER_SITE_OTHER): Payer: BC Managed Care – PPO | Admitting: Family Medicine

## 2020-07-07 VITALS — BP 118/70 | HR 100 | Ht 65.0 in | Wt 173.0 lb

## 2020-07-07 DIAGNOSIS — Z3046 Encounter for surveillance of implantable subdermal contraceptive: Secondary | ICD-10-CM

## 2020-07-07 DIAGNOSIS — Z3040 Encounter for surveillance of contraceptives, unspecified: Secondary | ICD-10-CM | POA: Diagnosis not present

## 2020-07-07 LAB — POCT URINE PREGNANCY: Preg Test, Ur: NEGATIVE

## 2020-07-07 NOTE — Assessment & Plan Note (Signed)
Patient had Nexplanon removed and replaced today 07/07/2020. -Patient given instructions post procedure for pain, bruising. -Nexplanon to be replaced 07/07/2023

## 2020-07-07 NOTE — Patient Instructions (Signed)
Follow up as needed!  Krystal Shoals, DO [facility], PGY-3 07/07/2020 10:01 AM

## 2020-07-12 MED ORDER — ETONOGESTREL 68 MG ~~LOC~~ IMPL
68.0000 mg | DRUG_IMPLANT | Freq: Once | SUBCUTANEOUS | Status: AC
  Administered 2020-07-07: 68 mg via SUBCUTANEOUS

## 2020-07-12 NOTE — Addendum Note (Signed)
Addended by: Veronda Prude on: 07/12/2020 04:56 PM   Modules accepted: Orders

## 2020-10-06 ENCOUNTER — Other Ambulatory Visit: Payer: Self-pay

## 2020-10-06 ENCOUNTER — Ambulatory Visit (INDEPENDENT_AMBULATORY_CARE_PROVIDER_SITE_OTHER): Payer: BC Managed Care – PPO

## 2020-10-06 DIAGNOSIS — Z23 Encounter for immunization: Secondary | ICD-10-CM | POA: Diagnosis not present

## 2021-01-26 DIAGNOSIS — H5213 Myopia, bilateral: Secondary | ICD-10-CM | POA: Diagnosis not present

## 2022-07-07 ENCOUNTER — Ambulatory Visit (HOSPITAL_COMMUNITY)
Admission: EM | Admit: 2022-07-07 | Discharge: 2022-07-07 | Disposition: A | Discharge status: Home / Self Care | Payer: Medicaid Other | Attending: Family Medicine | Admitting: Family Medicine

## 2022-07-07 ENCOUNTER — Encounter (HOSPITAL_COMMUNITY): Payer: Self-pay

## 2022-07-07 DIAGNOSIS — N939 Abnormal uterine and vaginal bleeding, unspecified: Secondary | ICD-10-CM

## 2022-07-07 MED ORDER — NORGESTIMATE-ETH ESTRADIOL 0.25-35 MG-MCG PO TABS: 1.0000 | ORAL_TABLET | Freq: Every day | ORAL | 1 refills | Status: DC

## 2022-07-07 NOTE — ED Triage Notes (Signed)
Patient reports that she is having a moderate amount of vaginal bleeding with clots during this period.

## 2022-07-07 NOTE — Discharge Instructions (Signed)
You were seen for heavy vaginal bleeding.  As this has happened in the past and you did well with an oral contraceptive, I have refilled this today for 2 months.    If you continue with heavy bleeding with clots please follow up with your ob/gyn.  If you develop abdominal pain, or dizziness or light headedness then please go to the ER for further evaluation.

## 2022-07-07 NOTE — ED Provider Notes (Signed)
[place]    CSN: [id number] Arrival date & time: 07/07/22  9983      History   Chief Complaint Chief Complaint  Patient presents with   Vaginal Bleeding    HPI Krystal Marshall is a 37 y.o. female.   Patient is here for abnormal period.  It started on Sunday, whish is when it was supposed to start.  Started normally, then she then started with clots on Tuesday and Wednesday.  It should be slowing down but is continues with heavy bleeding and clots.  She has to change her pad every 2 hrs or so.  Medium pad.  No abdominal pain or cramping.  Her periods are not necessarily regular.  She may go several  months between periods, and that is normal for her.  Last period was several months ago, which was normal.  No dizziness or light headedness.   She does have nexplanon.  Placed 07/2020, to be replaced 07/2023.   She did have irregular periods in the past, bleeding for 2-3 weeks at a times.   She was given sprintec x 3 months, which helped at that time.         Past Medical History:  Diagnosis Date   Breech presentation, no version 09/24/2017   Encounter to establish care with new doctor 01/24/2020   History of shoulder dystocia in prior pregnancy, currently pregnant 03/19/2017   Medical history non-contributory    Need for hepatitis C screening test 01/24/2020   Post term pregnancy 10/03/2017   Status post cesarean section 10/03/2017   T-incision; low vertical.  Please schedule this patient for Postpartum visit in: 4 weeks with the following provider: Any provider For C/S patients schedule nurse incision check in weeks 2 weeks: yes Low risk pregnancy complicated by: None Delivery mode:  CS Anticipated Birth Control:  Depo PP Procedures needed: Incision check  Schedule Integrated [place] visit: no      Status post primary low transverse cesarean section 10/03/2017   Supervision of other normal pregnancy, antepartum 03/19/2017    Clinic [facility] Prenatal Labs Dating u/s Blood type:  AB/Positive/-- (10/15 1041)  Genetic Screen  Quad: negative Antibody:Negative (10/15 1041) Anatomic [place]  Normal anatomy, ? Marginal previa [x ] re-eval mid January- resolved  Rubella: 28.80 (10/15 1041) GTT Early:  N/A             Third trimester: 79-140-102 RPR: Non Reactive (10/15 1041)  Flu vaccine 03/19/17 HBsAg: Negative (10/15 1041)  TDaP va    Patient Active Problem List   Diagnosis Date Noted   Encounter for removal and reinsertion of Nexplanon 07/07/2020   Encounter for surveillance of contraceptives 07/06/2020   COVID-19 vaccine administered 03/25/2020   Prolonged periods 01/24/2020   Language barrier affecting health care 04/22/2015    Past Surgical History:  Procedure Laterality Date   CESAREAN SECTION N/A 10/03/2017   Procedure: CESAREAN SECTION;  Surgeon: Mora Bellman, MD;  Location: [place];  Service: Obstetrics;  Laterality: N/A;   NO PAST SURGERIES      OB History     Gravida  3   Para  3   Term  3   Preterm  0   AB  0   Living  3      SAB  0   IAB  0   Ectopic  0   Multiple  0   Live Births  3            Home Medications  Prior to Admission medications   Medication Sig Start Date End Date Taking? Authorizing Provider  etonogestrel (NEXPLANON) 68 MG IMPL implant 1 each by Subdermal route once.    [provider]  ibuprofen (ADVIL) 200 MG tablet Take 200 mg by mouth every 6 (six) hours as needed for fever, headache or moderate pain.    [provider]  metaxalone (SKELAXIN) 800 MG tablet Take 1 tablet (800 mg total) by mouth 3 (three) times daily. 05/31/20   Hazel Sams, PA-C  naproxen (NAPROSYN) 500 MG tablet Take 1 tablet (500 mg total) by mouth 2 (two) times daily. 05/31/20   Hazel Sams, PA-C  traMADol (ULTRAM) 50 MG tablet Take 1 tablet (50 mg total) by mouth every 6 (six) hours as needed. 12/22/19   Carmin Muskrat, MD    Family History Family History  Problem Relation Age of Onset   Cancer  Neg Hx    Diabetes Neg Hx    Hypertension Neg Hx     Social History Social History   Tobacco Use   Smoking status: Never   Smokeless tobacco: Never  Vaping Use   Vaping Use: Never used  Substance Use Topics   Alcohol use: No   Drug use: No     Allergies   Patient has no known allergies.   Review of Systems Review of Systems  Constitutional: Negative.   HENT: Negative.    Respiratory: Negative.    Cardiovascular: Negative.   Gastrointestinal: Negative.   Genitourinary:  Positive for vaginal bleeding.  Psychiatric/Behavioral: Negative.       Physical Exam Triage Vital Signs ED Triage Vitals  Enc Vitals Group     BP 07/07/22 0907 133/83     Pulse Rate 07/07/22 0907 60     Resp 07/07/22 0907 16     Temp 07/07/22 0907 97.9 F (36.6 C)     Temp Source 07/07/22 0907 Oral     SpO2 07/07/22 0907 97 %     Weight --      Height --      Head Circumference --      Peak Flow --      Pain Score 07/07/22 0909 0     Pain Loc --      Pain Edu? --      Excl. in [place]? --    No data found.  Updated Vital Signs BP 133/83 (BP Location: Left Arm)   Pulse 60   Temp 97.9 F (36.6 C) (Oral)   Resp 16   LMP 07/07/2022   SpO2 97%   Visual Acuity Right Eye Distance:   Left Eye Distance:   Bilateral Distance:    Right Eye Near:   Left Eye Near:    Bilateral Near:     Physical Exam Constitutional:      Appearance: Normal appearance.  Cardiovascular:     Rate and Rhythm: Normal rate and regular rhythm.  Pulmonary:     Effort: Pulmonary effort is normal.     Breath sounds: Normal breath sounds.  Abdominal:     Palpations: Abdomen is soft.     Tenderness: There is abdominal tenderness. There is rebound. There is no guarding.  Skin:    General: Skin is warm.  Neurological:     General: No focal deficit present.     Mental Status: She is alert.  Psychiatric:        Mood and Affect: Mood normal.      UC Treatments /  Results  Labs (all labs ordered are  listed, but only abnormal results are displayed) Labs Reviewed - No data to display  EKG   Radiology No results found.  Procedures Procedures (including critical care time)  Medications Ordered in UC Medications - No data to display  Initial Impression / Assessment and Plan / UC Course  I have reviewed the triage vital signs and the nursing notes.  Pertinent labs & imaging results that were available during my care of the patient were reviewed by me and considered in my medical decision making (see chart for details).   Final Clinical Impressions(s) / UC Diagnoses   Final diagnoses:  Vaginal bleeding     Discharge Instructions      You were seen for heavy vaginal bleeding.  As this has happened in the past and you did well with an oral contraceptive, I have refilled this today for 2 months.    If you continue with heavy bleeding with clots please follow up with your ob/gyn.  If you develop abdominal pain, or dizziness or light headedness then please go to the ER for further evaluation.     ED Prescriptions     Medication Sig Dispense Auth. Provider   norgestimate-ethinyl estradiol (SPRINTEC 28) 0.25-35 MG-MCG tablet Take 1 tablet by mouth daily. 28 tablet Rondel Oh, MD      PDMP not reviewed this encounter.   Rondel Oh, MD 07/07/22 [phone]

## 2022-07-24 ENCOUNTER — Encounter: Payer: Self-pay | Admitting: Family Medicine

## 2022-07-24 ENCOUNTER — Ambulatory Visit (INDEPENDENT_AMBULATORY_CARE_PROVIDER_SITE_OTHER): Payer: Medicaid Other | Admitting: Family Medicine

## 2022-07-24 VITALS — BP 121/75 | HR 64 | Ht 65.0 in | Wt 172.2 lb

## 2022-07-24 DIAGNOSIS — N939 Abnormal uterine and vaginal bleeding, unspecified: Secondary | ICD-10-CM

## 2022-07-24 DIAGNOSIS — N920 Excessive and frequent menstruation with regular cycle: Secondary | ICD-10-CM | POA: Insufficient documentation

## 2022-07-24 MED ORDER — NAPROXEN 500 MG PO TABS: 500.0000 mg | ORAL_TABLET | Freq: Two times a day (BID) | ORAL | 0 refills | Status: AC

## 2022-07-24 NOTE — Patient Instructions (Addendum)
It was great seeing you today!  Today we discussed your vaginal bleeding. We will get blood work. I have prescribed naproxen 500 mg twice daily for 5 days. This should hopefully help with some of the bleeding.   Please follow up at your next scheduled appointment in 2-4 weeks, if anything arises between now and then, please don't hesitate to contact our office.   Thank you for allowing [place] to be a part of your medical care!  Thank you, Dr. Larae Grooms  Also a reminder of our clinic's no-show policy. Please make sure to arrive at least 15 minutes prior to your scheduled appointment time. Please try to cancel before 24 hours if you are not able to make it. If you no-show for 2 appointments then you will be receiving a warning letter. If you no-show after 3 visits, then you may be at risk of being dismissed from our clinic. This is to ensure that everyone is able to be seen in a timely manner. Thank you, we appreciate your assistance with this!

## 2022-07-24 NOTE — Progress Notes (Signed)
    SUBJECTIVE:   CHIEF COMPLAINT / HPI:   Patient presents with vaginal bleeding that has been ongoing for the past 3 weeks. She was evaluated at urgent care about 2 weeks ago, prescribed OCPs which she stopped taking it due to headaches. Has nexplanon placed 2 years ago. Bleeding improved when she was on the pill. Prior to this, she has had normal periods lasting about a week every month.   OBJECTIVE:   BP 121/75   Pulse 64   Ht 5' 5"$  (1.651 m)   Wt 172 lb 4 oz (78.1 kg)   LMP 07/07/2022   SpO2 99%   BMI 28.66 kg/m   General: Patient well-appearing, in no acute distress. CV: RRR, no murmurs or gallops auscultated Resp: CTAB, no wheezing, rales or rhonchi noted  ASSESSMENT/PLAN:   Menorrhagia -patient was prescribed OCPs recently but does not want to take these to reduce her bleeding due to the headaches she was getting. Per chart review, it seems she was prescribed megace in the past as well. -nexplanon in place  -prescribed naproxen bid for 5 day course, hopefully this will improve her vaginal bleeding -pending CBC to monitor Hgb with increased bleeding  -pending TSH to evaluate for other pituitary dysfunction -follow up in 2-4 weeks, discussed with patient that we can discuss another contraception method although this may still cause her menorrhagia or we can consider hysterectomy with referral to gynecology as she states she does not want any more children. Patient agrees to plan in place   -PHQ-9 score of 0 reviewed.   Donney Dice, [place]

## 2022-07-24 NOTE — Assessment & Plan Note (Addendum)
-  patient was prescribed OCPs recently but does not want to take these to reduce her bleeding due to the headaches she was getting. Per chart review, it seems she was prescribed megace in the past as well. -nexplanon in place  -prescribed naproxen bid for 5 day course, hopefully this will improve her vaginal bleeding -pending CBC to monitor Hgb with increased bleeding  -pending TSH to evaluate for other pituitary dysfunction -follow up in 2-4 weeks, discussed with patient that we can discuss another contraception method although this may still cause her menorrhagia or we can consider hysterectomy with referral to gynecology as she states she does not want any more children. Patient agrees to plan in place

## 2023-07-05 ENCOUNTER — Ambulatory Visit: Payer: Medicaid Other | Admitting: Family Medicine

## 2023-07-05 VITALS — BP 118/70 | HR 81 | Ht 65.0 in | Wt 183.2 lb

## 2023-07-05 DIAGNOSIS — Z3046 Encounter for surveillance of implantable subdermal contraceptive: Secondary | ICD-10-CM

## 2023-07-05 MED ORDER — ETONOGESTREL 68 MG ~~LOC~~ IMPL
68.0000 mg | DRUG_IMPLANT | Freq: Once | SUBCUTANEOUS | Status: AC
  Administered 2023-07-05: 68 mg via SUBCUTANEOUS

## 2023-07-05 NOTE — Patient Instructions (Addendum)
???????   Nexplanon ??? ???????  ? ???? ??? ??????? ????? ????? ???? 24 ????  ? ???? ??????? ?????/??????????/???????????? ?????? ????? ?? ?????  ? ??? ???? ???? ??? ?? ????? ?? ????? ?? ????? ?? ???? ????? ????? ??????? ??? ?????    ???? ????? ???? ?? PCP ?????? ???? ??? ????? ???? ????? ??? ?????.   Nexplanon Instructions After Insertion  Keep bandage clean and dry for 24 hours  May use ice/Tylenol/Ibuprofen for soreness or pain  If you develop fever, drainage or increased warmth from incision site-contact office immediately    Please schedule with PCP to have your Pap smear done for cervical cancer screening.

## 2023-07-05 NOTE — Progress Notes (Signed)
GYNECOLOGY PROCEDURE NOTE  Krystal Marshall is a 38 y.o. [id number] here for Nexplanon removal and re-insertion. No other gynecologic concerns.   Nexplanon Removal and Insertion  Patient identified, informed consent performed, consent signed.   Patient does understand that irregular bleeding is a very common side effect of this medication. Reasonably sure she is not pregnant since she is having Nexplanon removed. Appropriate time out taken. Implanon site identified. Area prepped in usual sterile fashon. Five ml of 1% lidocaine was used to anesthetize the area at the distal end of the implant. A small stab incision was made right beside the implant on the distal portion. The Nexplanon rod was grasped using hemostats and removed in two pieces. The Nexplanon was inspected and all component were accounted for. There was minimal blood loss. There were no complications. Area was then injected with 5 ml of 1 % lidocaine. She was re-prepped with betadine, Nexplanon removed from packaging, Device confirmed in needle, then inserted full length of needle and withdrawn per handbook instructions. Nexplanon was able to palpated in the patient's arm; patient palpated the insert herself.  There was minimal blood loss. Patient insertion site covered with guaze and a pressure bandage to reduce any bruising. The patient tolerated the procedure well and was given post procedure instructions.   Elberta Fortis, MD 07/05/2023, 9:29 AM PGY-2, [facility]

## 2023-11-02 ENCOUNTER — Ambulatory Visit (INDEPENDENT_AMBULATORY_CARE_PROVIDER_SITE_OTHER): Admitting: Student

## 2023-11-02 VITALS — BP 112/61 | HR 81 | Ht 65.0 in | Wt 181.4 lb

## 2023-11-02 DIAGNOSIS — N921 Excessive and frequent menstruation with irregular cycle: Secondary | ICD-10-CM

## 2023-11-02 LAB — POCT URINE PREGNANCY: Preg Test, Ur: NEGATIVE

## 2023-11-02 MED ORDER — NORGESTIMATE-ETH ESTRADIOL 0.25-35 MG-MCG PO TABS: 1.0000 | ORAL_TABLET | Freq: Every day | ORAL | 0 refills | Status: DC

## 2023-11-02 NOTE — Patient Instructions (Signed)
 It was great to see you today! Thank you for choosing [facility] for your primary care.  Today we addressed: I am prescribing you oral contraceptive pills to take which should improve the breakthrough bleeding.  We are getting labs to assess your fatigue to make sure your blood count is normal.  Should your bleeding not improved with the pills, I do recommend you return for further discussion and an examination.  If you haven't already, sign up for My Chart to have easy access to your labs results, and communication with your primary care physician.  Return if symptoms worsen or fail to improve. Please arrive 15 minutes before your appointment to ensure smooth check in process.  We appreciate your efforts in making this happen.  Thank you for allowing me to participate in your care, Veronia Goon, DO 11/02/2023, 9:14 AM PGY-3, [facility]

## 2023-11-02 NOTE — Assessment & Plan Note (Addendum)
 Limited frequency, approximately once per year with Nexplanon  in place.  Negative pregnancy test. -Obtain CBC, TSH - OCPs for breakthrough bleeding - Return if continuing to happen despite OCPs, recommend pelvic exam and potentially referral to gynecology for hysterectomy in the future

## 2023-11-02 NOTE — Progress Notes (Signed)
  SUBJECTIVE:   CHIEF COMPLAINT / HPI:   Krystal Marshall is a 38 year old female with Nexplanon  placed in late January who presents for breakthrough bleeding that began 1 week ago.  She states it came on significantly with clots and some discomfort.  Discomfort lasted 1 day and the clots became lighter however last night the clots became significant again up to a couple centimeters in size.  She has had this in the past and states with the Nexplanon  in place, she gets this bleeding approximately once per year.  It has improved previously with OCPs.  She deferred pelvic exam today with offer of having female provider doing this.  She does note that she felt fatigued the other day.  She is urinating and stooling appropriately without blood in either of these.  No history of migraines.  PERTINENT  PMH / PSH: Nexplanon  in place  OBJECTIVE:  BP 112/61   Pulse 81   Ht 5\' 5"  (1.651 m)   Wt 181 lb 6.4 oz (82.3 kg)   SpO2 99%   BMI 30.19 kg/m  General: Well-appearing, NAD CV: RRR, no murmurs appreciable Abdomen: Soft, nontender, normoactive bowel sounds, no suprapubic tenderness  ASSESSMENT/PLAN:   Assessment & Plan Menorrhagia with irregular cycle Limited frequency, approximately once per year with Nexplanon  in place.  Negative pregnancy test. -Obtain CBC, TSH - OCPs for breakthrough bleeding - Return if continuing to happen despite OCPs, recommend pelvic exam and potentially referral to gynecology for hysterectomy in the future Return if symptoms worsen or fail to improve. Veronia Goon, DO 11/02/2023, 9:42 AM PGY-3, [facility]

## 2023-11-03 LAB — CBC
Hematocrit: 39.1 % (ref 34.0–46.6)
Hemoglobin: 12.3 g/dL (ref 11.1–15.9)
MCH: 27.2 pg (ref 26.6–33.0)
MCHC: 31.5 g/dL (ref 31.5–35.7)
MCV: 87 fL (ref 79–97)
Platelets: 324 10*3/uL (ref 150–450)
RBC: 4.52 x10E6/uL (ref 3.77–5.28)
RDW: 13.5 % (ref 11.7–15.4)
WBC: 9.5 10*3/uL (ref 3.4–10.8)

## 2023-11-03 LAB — TSH: TSH: 2.15 u[IU]/mL (ref 0.450–4.500)

## 2023-11-05 ENCOUNTER — Ambulatory Visit: Payer: Self-pay | Admitting: Student

## 2023-11-08 ENCOUNTER — Encounter: Payer: Self-pay | Admitting: Student

## 2023-11-08 ENCOUNTER — Ambulatory Visit (INDEPENDENT_AMBULATORY_CARE_PROVIDER_SITE_OTHER): Admitting: Student

## 2023-11-08 VITALS — BP 123/69 | HR 67 | Ht 65.0 in | Wt 179.8 lb

## 2023-11-08 DIAGNOSIS — N921 Excessive and frequent menstruation with irregular cycle: Secondary | ICD-10-CM | POA: Diagnosis present

## 2023-11-08 MED ORDER — NAPROXEN 500 MG PO TABS: 500.0000 mg | ORAL_TABLET | Freq: Two times a day (BID) | ORAL | 0 refills | Status: AC

## 2023-11-08 NOTE — Patient Instructions (Signed)
 It was great to see you! Thank you for allowing me to participate in your care!   Our plans for today:  - We will check kidney function today - Stop sprintec, start naproxen  as needed when having bleeding  Take care and seek immediate care sooner if you develop any concerns.  Genora Kidd, MD

## 2023-11-08 NOTE — Progress Notes (Signed)
    SUBJECTIVE:   CHIEF COMPLAINT / HPI: Bleeding f/u   Seen for menorrhagia 11/02/2023 and this occurred after Nexplanon  was reinserted in January patient had breakthrough bleeding.  Was started on OCPs for breakthrough bleeding (Sprintec 28).   Discussed the use of AI scribe software for clinical note transcription with the patient, who gave verbal consent to proceed.  History of Present Illness Krystal Marshall is a 38 year old female who presents with headaches and stomach pain after taking Sprintec for Nexplanon -related bleeding.  She experienced headaches and stomach pain after starting Sprintec, prescribed for Nexplanon -related bleeding. After discontinuing Sprintec yesterday, her symptoms resolved, and bleeding reduced to minimal spotting.  She previously used naproxen  twice daily for pain and bleeding control, which was effective for one year. She experienced heavy vaginal bleeding for four to five days last week, associated with her menstrual period. Since the Nexplanon  replacement in January, she had no bleeding until this recent episode.  She previously used ibuprofen  daily for a month due to dental pain but none right now. She currently has no pain or symptoms.   PERTINENT  PMH / PSH: reviewed  OBJECTIVE:   BP 123/69   Pulse 67   Ht 5\' 5"  (1.651 m)   Wt 179 lb 12.8 oz (81.6 kg)   SpO2 100%   BMI 29.92 kg/m   General: Well appearing, NAD, awake, alert, responsive to questions Head: Normocephalic atraumatic CV: Regular rate and rhythm no murmurs rubs or gallops Respiratory: Clear to ausculation bilaterally, no wheezes rales or crackles, chest rises symmetrically,  no increased work of breathing on RA Abdomen: Soft, non-tender, non-distended  ASSESSMENT/PLAN:   Assessment & Plan Menorrhagia with irregular cycle Bleeding resolved with sprintec however had side effects and stopped this.. Previously managed with naproxen . 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

## 2023-11-09 LAB — BASIC METABOLIC PANEL WITH GFR
BUN/Creatinine Ratio: 16 (ref 9–23)
BUN: 9 mg/dL (ref 6–20)
CO2: 21 mmol/L (ref 20–29)
Calcium: 9.3 mg/dL (ref 8.7–10.2)
Chloride: 103 mmol/L (ref 96–106)
Creatinine, Ser: 0.56 mg/dL — ABNORMAL LOW (ref 0.57–1.00)
Glucose: 83 mg/dL (ref 70–99)
Potassium: 4 mmol/L (ref 3.5–5.2)
Sodium: 138 mmol/L (ref 134–144)
eGFR: 119 mL/min/{1.73_m2} (ref >59)

## 2023-11-09 NOTE — Assessment & Plan Note (Signed)
 Bleeding resolved with sprintec however had side effects and stopped this.. Previously managed with naproxen . 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

## 2023-11-12 ENCOUNTER — Ambulatory Visit: Payer: Self-pay | Admitting: Student

## 2023-11-27 ENCOUNTER — Other Ambulatory Visit: Payer: Self-pay | Admitting: Student

## 2023-11-27 DIAGNOSIS — N921 Excessive and frequent menstruation with irregular cycle: Secondary | ICD-10-CM

## 2024-01-30 ENCOUNTER — Ambulatory Visit (HOSPITAL_COMMUNITY)
Admission: EM | Admit: 2024-01-30 | Discharge: 2024-01-30 | Disposition: A | Discharge status: Home / Self Care | Attending: Nurse Practitioner | Admitting: Nurse Practitioner

## 2024-01-30 ENCOUNTER — Encounter (HOSPITAL_COMMUNITY): Payer: Self-pay

## 2024-01-30 DIAGNOSIS — M62830 Muscle spasm of back: Secondary | ICD-10-CM

## 2024-01-30 DIAGNOSIS — M546 Pain in thoracic spine: Secondary | ICD-10-CM

## 2024-01-30 MED ORDER — KETOROLAC TROMETHAMINE 60 MG/2ML IM SOLN
60.0000 mg | Freq: Once | INTRAMUSCULAR | Status: AC
  Administered 2024-01-30: 60 mg via INTRAMUSCULAR

## 2024-01-30 MED ORDER — CYCLOBENZAPRINE HCL 10 MG PO TABS: 10.0000 mg | ORAL_TABLET | Freq: Two times a day (BID) | ORAL | 0 refills | Status: AC | PRN

## 2024-01-30 MED ORDER — DEXAMETHASONE SODIUM PHOSPHATE 10 MG/ML IJ SOLN
10.0000 mg | Freq: Once | INTRAMUSCULAR | Status: AC
  Administered 2024-01-30: 10 mg via INTRAMUSCULAR

## 2024-01-30 MED ORDER — NAPROXEN 500 MG PO TABS: 500.0000 mg | ORAL_TABLET | Freq: Two times a day (BID) | ORAL | 0 refills | Status: AC

## 2024-01-30 MED ORDER — DEXAMETHASONE SODIUM PHOSPHATE 10 MG/ML IJ SOLN
INTRAMUSCULAR | Status: AC
  Filled 2024-01-30: qty 1

## 2024-01-30 MED ORDER — KETOROLAC TROMETHAMINE 60 MG/2ML IM SOLN
INTRAMUSCULAR | Status: AC
  Filled 2024-01-30: qty 2

## 2024-01-30 NOTE — ED Provider Notes (Signed)
 [facility]    CSN: [id number] Arrival date & time: 01/30/24  1152      History   Chief Complaint Chief Complaint  Patient presents with   Back Pain    HPI Krystal Marshall is a 38 y.o. female.   Discussed the use of AI scribe software for clinical note transcription with the patient, who gave verbal consent to proceed.   The patient presents with back pain that started yesterday around 8 PM. The pain is located in the right thoracic region, specifically to one specific area. The patient describes the pain as moving and feels it in the muscles. The pain does not radiate down to the buttocks or legs but remains localized to the thoracic area. The patient reports possibly engaging in heavy lifting or holding heavy items yesterday morning, which may have contributed to the onset of pain. However, there was no specific moment when the pain suddenly began. The patient denies any urinary symptoms, numbness, or tingling sensations associated with the pain. To manage the pain, the patient has tried using pain patches as well as Tylenol  and Motrin , but these have not provided significant relief.   The following portions of the patient's history were reviewed and updated as appropriate: allergies, current medications, past family history, past medical history, past social history, past surgical history, and problem list.    Past Medical History:  Diagnosis Date   Breech presentation, no version 09/24/2017   Encounter to establish care with new doctor 01/24/2020   History of shoulder dystocia in prior pregnancy, currently pregnant 03/19/2017   Medical history non-contributory    Need for hepatitis C screening test 01/24/2020   Post term pregnancy 10/03/2017   Status post cesarean section 10/03/2017   T-incision; low vertical.  Please schedule this patient for Postpartum visit in: 4 weeks with the following provider: Any provider For C/S patients schedule nurse incision check in weeks 2  weeks: yes Low risk pregnancy complicated by: None Delivery mode:  CS Anticipated Birth Control:  Depo PP Procedures needed: Incision check  Schedule Integrated BH visit: no      Status post primary low transverse cesarean section 10/03/2017   Supervision of other normal pregnancy, antepartum 03/19/2017    Clinic [facility] Prenatal Labs Dating u/s Blood type: AB/Positive/-- (10/15 1041)  Genetic Screen  Quad: negative Antibody:Negative (10/15 1041) Anatomic US   Normal anatomy, ? Marginal previa [x ] re-eval mid January- resolved  Rubella: 28.80 (10/15 1041) GTT Early:  N/A             Third trimester: 79-140-102 RPR: Non Reactive (10/15 1041)  Flu vaccine 03/19/17 HBsAg: Negative (10/15 1041)  TDaP va    Patient Active Problem List   Diagnosis Date Noted   Menorrhagia 07/24/2022   Encounter for removal and reinsertion of Nexplanon  07/07/2020   Encounter for surveillance of contraceptives 07/06/2020   COVID-19 vaccine administered 03/25/2020   Prolonged periods 01/24/2020   Language barrier affecting health care 04/22/2015    Past Surgical History:  Procedure Laterality Date   CESAREAN SECTION N/A 10/03/2017   Procedure: CESAREAN SECTION;  Surgeon: Alger Gong, MD;  Location: [facility];  Service: Obstetrics;  Laterality: N/A;   NO PAST SURGERIES      OB History     Gravida  3   Para  3   Term  3   Preterm  0   AB  0   Living  3      SAB  0  IAB  0   Ectopic  0   Multiple  0   Live Births  3            Home Medications    Prior to Admission medications   Medication Sig Start Date End Date Taking? Authorizing Provider  cyclobenzaprine  (FLEXERIL ) 10 MG tablet Take 1 tablet (10 mg total) by mouth 2 (two) times daily as needed for muscle spasms. 01/30/24  Yes Iola Lukes, FNP  naproxen  (NAPROSYN ) 500 MG tablet Take 1 tablet (500 mg total) by mouth 2 (two) times daily with a meal. Take with food to avoid stomach upset. Do not take any additional NSAIDs  while on this. You may take tylenol  in addition to this if needed for extra pain relief. 01/30/24  Yes Iola Lukes, FNP  etonogestrel  (NEXPLANON ) 68 MG IMPL implant 1 each by Subdermal route once.    [provider]    Family History Family History  Problem Relation Age of Onset   Cancer Neg Hx    Diabetes Neg Hx    Hypertension Neg Hx     Social History Social History   Tobacco Use   Smoking status: Never   Smokeless tobacco: Never  Vaping Use   Vaping status: Never Used  Substance Use Topics   Alcohol use: No   Drug use: No     Allergies   Patient has no known allergies.   Review of Systems Review of Systems  Gastrointestinal: Negative.   Genitourinary:  Negative for dysuria.  Musculoskeletal:  Positive for back pain.  Neurological:  Negative for weakness and numbness.  All other systems reviewed and are negative.    Physical Exam Triage Vital Signs ED Triage Vitals  Encounter Vitals Group     BP 01/30/24 1339 115/80     Girls Systolic BP Percentile --      Girls Diastolic BP Percentile --      Boys Systolic BP Percentile --      Boys Diastolic BP Percentile --      Pulse Rate 01/30/24 1339 (!) 1     Resp 01/30/24 1339 16     Temp 01/30/24 1339 98.8 F (37.1 C)     Temp Source 01/30/24 1339 Oral     SpO2 01/30/24 1339 100 %     Weight --      Height --      Head Circumference --      Peak Flow --      Pain Score 01/30/24 1340 8     Pain Loc --      Pain Education --      Exclude from Growth Chart --    No data found.  Updated Vital Signs BP 115/80 (BP Location: Left Arm)   Pulse (!) 1   Temp 98.8 F (37.1 C) (Oral)   Resp 16   SpO2 100%   Visual Acuity Right Eye Distance:   Left Eye Distance:   Bilateral Distance:    Right Eye Near:   Left Eye Near:    Bilateral Near:     Physical Exam Vitals reviewed.  Constitutional:      General: She is awake. She is not in acute distress.    Appearance: Normal appearance. She  is well-developed and well-groomed. She is not ill-appearing, toxic-appearing or diaphoretic.     Comments: Patient uncomfortable with frequent grimacing from thoracic back spasms but no acute distress noted  HENT:     Head: Normocephalic.  Mouth/Throat:     Mouth: Mucous membranes are moist.  Cardiovascular:     Rate and Rhythm: Normal rate and regular rhythm.  Pulmonary:     Effort: Pulmonary effort is normal.     Breath sounds: Normal breath sounds.  Abdominal:     Palpations: Abdomen is soft.     Tenderness: There is no right CVA tenderness or left CVA tenderness.  Musculoskeletal:        General: Normal range of motion.     Cervical back: Normal, normal range of motion and neck supple.     Thoracic back: Spasms and tenderness present.       Back:     Comments: Pain with muscle spasms is noted in the right thoracic region. There is no swelling or limitation in range of motion. The patient demonstrates intermittent grimacing associated with the spasms, but no focal neurological deficits  Skin:    General: Skin is warm and dry.  Neurological:     General: No focal deficit present.     Mental Status: She is alert and oriented to person, place, and time.     Cranial Nerves: Cranial nerves 2-12 are intact.     Sensory: Sensation is intact. No sensory deficit.     Motor: Motor function is intact. No weakness.     Coordination: Coordination is intact.     Gait: Gait is intact.  Psychiatric:        Mood and Affect: Mood normal.        Speech: Speech normal.        Behavior: Behavior normal. Behavior is cooperative.      UC Treatments / Results  Labs (all labs ordered are listed, but only abnormal results are displayed) Labs Reviewed - No data to display  EKG   Radiology No results found.  Procedures Procedures (including critical care time)  Medications Ordered in UC Medications  ketorolac  (TORADOL ) injection 60 mg (60 mg Intramuscular Given 01/30/24 1441)   dexamethasone  (DECADRON ) injection 10 mg (10 mg Intramuscular Given 01/30/24 1441)    Initial Impression / Assessment and Plan / UC Course  I have reviewed the triage vital signs and the nursing notes.  Pertinent labs & imaging results that were available during my care of the patient were reviewed by me and considered in my medical decision making (see chart for details).    The patient presents with acute right-sided thoracic back pain that began yesterday evening, described as muscular in nature with associated spasms. The pain is localized to the right thoracic region without radiation to the buttocks or legs. She reports possible heavy lifting earlier in the day but denies a clear injury. There are no urinary symptoms, numbness, or tingling. She has tried over-the-counter pain patches, acetaminophen , and ibuprofen  with limited relief. On exam, findings are most consistent with musculoskeletal strain. She was treated with intramuscular steroid and Toradol  injections for anti-inflammatory and immediate pain relief. Naproxen  was prescribed twice daily with instructions to avoid other NSAIDs, while acetaminophen  may be used for additional pain control. Flexeril  was prescribed as needed for muscle spasms, and she was advised to continue pain patches, alternate ice and heat therapy, and avoid heavy lifting or strenuous activity until symptoms improve. Follow-up with her primary care provider was recommended if symptoms persist, and she was instructed to seek emergency care for worsening pain, new neurologic symptoms, urinary retention, or loss of bowel or bladder control.  Today's evaluation has revealed no signs of a dangerous  process. Discussed diagnosis with patient and/or guardian. Patient and/or guardian aware of their diagnosis, possible red flag symptoms to watch out for and need for close follow up. Patient and/or guardian understands verbal and written discharge instructions. Patient and/or  guardian comfortable with plan and disposition.  Patient and/or guardian has a clear mental status at this time, good insight into illness (after discussion and teaching) and has clear judgment to make decisions regarding their care  Documentation was completed with the aid of voice recognition software. Transcription may contain typographical errors.  Final Clinical Impressions(s) / UC Diagnoses   Final diagnoses:  Spasm of thoracic back muscle  Acute right-sided thoracic back pain     Discharge Instructions      You were seen today for right-sided thoracic back pain with muscle spasms. Your symptoms are most consistent with a musculoskeletal strain. You received injections for inflammation and pain relief during your visit. You were prescribed naproxen  to take twice daily; do not take other over-the-counter NSAIDs such as ibuprofen  or Aleve  while using this medication. You may use Tylenol  if you need additional pain control. Flexeril  has also been prescribed to take as needed for muscle spasms.  At home, you may continue to use pain patches if they help. Alternating ice and heat to the area can also reduce discomfort--apply ice for 15-20 minutes at a time during the first 48 hours, then switch to heat or alternate between the two. Avoid heavy lifting, pushing, or pulling until your symptoms improve. Gentle stretching, good posture, and supportive seating may also help reduce strain.  Follow up with your primary care provider if your pain does not improve within a week or if it continues to interfere with your daily activities. Go to the emergency department right away if you develop severe or worsening back pain, weakness, numbness, tingling, difficulty walking, loss of bladder or bowel control, or new urinary problems.     ED Prescriptions     Medication Sig Dispense Auth. Provider   naproxen  (NAPROSYN ) 500 MG tablet Take 1 tablet (500 mg total) by mouth 2 (two) times daily with a meal.  Take with food to avoid stomach upset. Do not take any additional NSAIDs while on this. You may take tylenol  in addition to this if needed for extra pain relief. 20 tablet Iola Lukes, FNP   cyclobenzaprine  (FLEXERIL ) 10 MG tablet Take 1 tablet (10 mg total) by mouth 2 (two) times daily as needed for muscle spasms. 20 tablet Iola Lukes, FNP      PDMP not reviewed this encounter.   Iola Lukes, [place] 01/30/24 1447

## 2024-01-30 NOTE — Discharge Instructions (Addendum)
 You were seen today for right-sided thoracic back pain with muscle spasms. Your symptoms are most consistent with a musculoskeletal strain. You received injections for inflammation and pain relief during your visit. You were prescribed naproxen  to take twice daily; do not take other over-the-counter NSAIDs such as ibuprofen  or Aleve  while using this medication. You may use Tylenol  if you need additional pain control. Flexeril  has also been prescribed to take as needed for muscle spasms.  At home, you may continue to use pain patches if they help. Alternating ice and heat to the area can also reduce discomfort--apply ice for 15-20 minutes at a time during the first 48 hours, then switch to heat or alternate between the two. Avoid heavy lifting, pushing, or pulling until your symptoms improve. Gentle stretching, good posture, and supportive seating may also help reduce strain.  Follow up with your primary care provider if your pain does not improve within a week or if it continues to interfere with your daily activities. Go to the emergency department right away if you develop severe or worsening back pain, weakness, numbness, tingling, difficulty walking, loss of bladder or bowel control, or new urinary problems.

## 2024-01-30 NOTE — ED Triage Notes (Signed)
 Pt states mid right back pain under her right shoulder blade since yesterday.  States she has been taking tylenol  and motrin  with some relief.
